# Patient Record
Sex: Female | Born: 1945 | Race: Black or African American | Hispanic: No | Smoking: Former smoker
Health system: Southern US, Community
[De-identification: ages and names within clinical notes are randomized; demographics above are authoritative.]

## PROBLEM LIST (undated history)

## (undated) DIAGNOSIS — E119 Type 2 diabetes mellitus without complications: Secondary | ICD-10-CM

## (undated) DIAGNOSIS — E785 Hyperlipidemia, unspecified: Secondary | ICD-10-CM

## (undated) DIAGNOSIS — I10 Essential (primary) hypertension: Secondary | ICD-10-CM

## (undated) DIAGNOSIS — I219 Acute myocardial infarction, unspecified: Secondary | ICD-10-CM

## (undated) HISTORY — PX: CORONARY ANGIOPLASTY WITH STENT PLACEMENT: SHX49

## (undated) HISTORY — DX: Type 2 diabetes mellitus without complications: E11.9

## (undated) HISTORY — PX: ABDOMINAL HYSTERECTOMY: SHX81

## (undated) HISTORY — DX: Essential (primary) hypertension: I10

## (undated) HISTORY — DX: Acute myocardial infarction, unspecified: I21.9

## (undated) HISTORY — DX: Hyperlipidemia, unspecified: E78.5

---

## 2005-11-13 ENCOUNTER — Emergency Department (HOSPITAL_COMMUNITY): Admission: EM | Admit: 2005-11-13 | Discharge: 2005-11-13 | Payer: Self-pay | Admitting: Emergency Medicine

## 2007-02-21 ENCOUNTER — Emergency Department (HOSPITAL_COMMUNITY): Admission: EM | Admit: 2007-02-21 | Discharge: 2007-02-22 | Payer: Self-pay | Admitting: Emergency Medicine

## 2014-09-23 ENCOUNTER — Other Ambulatory Visit (HOSPITAL_COMMUNITY): Payer: Self-pay | Admitting: Family Medicine

## 2014-09-23 DIAGNOSIS — R229 Localized swelling, mass and lump, unspecified: Principal | ICD-10-CM

## 2014-09-23 DIAGNOSIS — IMO0002 Reserved for concepts with insufficient information to code with codable children: Secondary | ICD-10-CM

## 2014-09-30 ENCOUNTER — Ambulatory Visit (HOSPITAL_COMMUNITY)
Admission: RE | Admit: 2014-09-30 | Discharge: 2014-09-30 | Disposition: A | Discharge status: Home / Self Care | Payer: PRIVATE HEALTH INSURANCE | Source: Ambulatory Visit | Attending: Family Medicine | Admitting: Family Medicine

## 2014-09-30 DIAGNOSIS — R229 Localized swelling, mass and lump, unspecified: Secondary | ICD-10-CM | POA: Insufficient documentation

## 2014-09-30 DIAGNOSIS — IMO0002 Reserved for concepts with insufficient information to code with codable children: Secondary | ICD-10-CM

## 2015-10-25 LAB — HEMOGLOBIN A1C: Hemoglobin A1C: 11.4

## 2015-10-26 ENCOUNTER — Other Ambulatory Visit (HOSPITAL_COMMUNITY): Payer: Self-pay | Admitting: Family

## 2015-10-26 DIAGNOSIS — Z09 Encounter for follow-up examination after completed treatment for conditions other than malignant neoplasm: Secondary | ICD-10-CM

## 2015-11-03 ENCOUNTER — Ambulatory Visit (HOSPITAL_COMMUNITY)
Admission: RE | Admit: 2015-11-03 | Discharge: 2015-11-03 | Disposition: A | Discharge status: Home / Self Care | Payer: Medicare Other | Source: Ambulatory Visit | Attending: Family | Admitting: Family

## 2015-11-03 DIAGNOSIS — Z09 Encounter for follow-up examination after completed treatment for conditions other than malignant neoplasm: Secondary | ICD-10-CM

## 2015-11-03 DIAGNOSIS — R921 Mammographic calcification found on diagnostic imaging of breast: Secondary | ICD-10-CM | POA: Diagnosis present

## 2015-11-19 ENCOUNTER — Encounter: Payer: Self-pay | Admitting: "Endocrinology

## 2015-11-19 ENCOUNTER — Ambulatory Visit (INDEPENDENT_AMBULATORY_CARE_PROVIDER_SITE_OTHER): Payer: Medicare Other | Admitting: "Endocrinology

## 2015-11-19 VITALS — BP 140/92 | HR 76 | Ht 65.0 in | Wt 193.0 lb

## 2015-11-19 DIAGNOSIS — E782 Mixed hyperlipidemia: Secondary | ICD-10-CM | POA: Insufficient documentation

## 2015-11-19 DIAGNOSIS — E785 Hyperlipidemia, unspecified: Secondary | ICD-10-CM | POA: Diagnosis not present

## 2015-11-19 DIAGNOSIS — N182 Chronic kidney disease, stage 2 (mild): Secondary | ICD-10-CM

## 2015-11-19 DIAGNOSIS — E1159 Type 2 diabetes mellitus with other circulatory complications: Secondary | ICD-10-CM

## 2015-11-19 DIAGNOSIS — Z794 Long term (current) use of insulin: Secondary | ICD-10-CM

## 2015-11-19 DIAGNOSIS — I10 Essential (primary) hypertension: Secondary | ICD-10-CM | POA: Diagnosis not present

## 2015-11-19 DIAGNOSIS — E1165 Type 2 diabetes mellitus with hyperglycemia: Secondary | ICD-10-CM

## 2015-11-19 DIAGNOSIS — E1122 Type 2 diabetes mellitus with diabetic chronic kidney disease: Secondary | ICD-10-CM

## 2015-11-19 DIAGNOSIS — IMO0002 Reserved for concepts with insufficient information to code with codable children: Secondary | ICD-10-CM

## 2015-11-19 MED ORDER — INSULIN GLARGINE 100 UNIT/ML SOLOSTAR PEN: 60.0000 [IU] | PEN_INJECTOR | Freq: Every day | SUBCUTANEOUS | Status: DC

## 2015-11-19 MED ORDER — INSULIN LISPRO 100 UNIT/ML (KWIKPEN): 10.0000 [IU] | PEN_INJECTOR | Freq: Three times a day (TID) | SUBCUTANEOUS | Status: DC

## 2015-11-19 NOTE — Patient Instructions (Signed)

## 2015-11-19 NOTE — Progress Notes (Signed)
Subjective:    Patient ID: Robyn Crawford, female    DOB: [birth year]. Patient is being seen in consultation for management of diabetes requested by  Altamease Oiler, FNP  Past Medical History  Diagnosis Date  . Diabetes mellitus, type II (HCC)   . Hypertension    Past Surgical History  Procedure Laterality Date  . Abdominal hysterectomy    . Coronary angioplasty with stent placement     Social History   Social History  . Marital Status: Widowed    Spouse Name: N/A  . Number of Children: N/A  . Years of Education: N/A   Social History Main Topics  . Smoking status: Former Games developer  . Smokeless tobacco: None  . Alcohol Use: No  . Drug Use: No  . Sexual Activity: Not Asked   Other Topics Concern  . None   Social History Narrative  . None   Outpatient Encounter Prescriptions as of 11/19/2015  Medication Sig  . amLODipine (NORVASC) 10 MG tablet Take 10 mg by mouth daily.  Marland Kitchen atorvastatin (LIPITOR) 20 MG tablet Take 20 mg by mouth daily.  . clopidogrel (PLAVIX) 75 MG tablet Take 75 mg by mouth daily.  . hydrochlorothiazide (HYDRODIURIL) 12.5 MG tablet Take 12.5 mg by mouth daily.  . Insulin Glargine (LANTUS SOLOSTAR) 100 UNIT/ML Solostar Pen Inject 60 Units into the skin at bedtime.  Marland Kitchen lisinopril (PRINIVIL,ZESTRIL) 40 MG tablet Take 40 mg by mouth daily.  . metFORMIN (GLUCOPHAGE) 500 MG tablet Take 500 mg by mouth 2 (two) times daily with a meal.  . [DISCONTINUED] insulin aspart (NOVOLOG FLEXPEN) 100 UNIT/ML FlexPen Inject 14 Units into the skin 3 (three) times daily with meals.  . [DISCONTINUED] Insulin Glargine (LANTUS SOLOSTAR) 100 UNIT/ML Solostar Pen Inject 60 Units into the skin at bedtime.  . insulin lispro (HUMALOG KWIKPEN) 100 UNIT/ML KiwkPen Inject 0.1-0.16 mLs (10-16 Units total) into the skin 3 (three) times daily.   No facility-administered encounter medications on file as of 11/19/2015.   ALLERGIES: Allergies  Allergen Reactions  . Codeine     VACCINATION STATUS:  There is no immunization history on file for this patient.  Diabetes She presents for her initial diabetic visit. She has type 2 diabetes mellitus. Onset time: She was diagnosed at approximate age of 70 years. Her disease course has been worsening. There are no hypoglycemic associated symptoms. Pertinent negatives for hypoglycemia include no confusion, headaches, pallor or seizures. Associated symptoms include fatigue, polydipsia and polyuria. Pertinent negatives for diabetes include no chest pain and no polyphagia. There are no hypoglycemic complications. Symptoms are worsening. Diabetic complications include heart disease. (Status post stent placement 3.) Risk factors for coronary artery disease include diabetes mellitus, dyslipidemia, hypertension, obesity, sedentary lifestyle and tobacco exposure. Current diabetic treatment includes insulin injections and oral agent (monotherapy) (She is on Lantus 60 units daily at bedtime and NovoLog 14 units 3 times a day with meals.). Her weight is increasing steadily. She is following a generally unhealthy diet. When asked about meal planning, she reported none. She has not had a previous visit with a dietitian. She never participates in exercise. Home blood sugar record trend: She did not bring any meter nor log of blood glucose to review today. She admits to monitoring only once or twice a day. An ACE inhibitor/angiotensin II receptor blocker is being taken. Eye exam is current.  Hyperlipidemia This is a chronic problem. The current episode started more than 1 year ago. Pertinent negatives include no chest pain,  myalgias or shortness of breath. Current antihyperlipidemic treatment includes statins. Risk factors for coronary artery disease include dyslipidemia, diabetes mellitus, hypertension, obesity and a sedentary lifestyle.  Hypertension This is a chronic problem. The current episode started more than 1 year ago. Pertinent negatives  include no chest pain, headaches, palpitations or shortness of breath. Past treatments include ACE inhibitors. Hypertensive end-organ damage includes CAD/MI.       Review of Systems  Constitutional: Positive for fatigue. Negative for fever, chills and unexpected weight change.  HENT: Negative for trouble swallowing and voice change.   Eyes: Negative for visual disturbance.  Respiratory: Negative for cough, shortness of breath and wheezing.   Cardiovascular: Negative for chest pain, palpitations and leg swelling.  Gastrointestinal: Negative for nausea, vomiting and diarrhea.  Endocrine: Positive for polydipsia and polyuria. Negative for cold intolerance, heat intolerance and polyphagia.  Musculoskeletal: Negative for myalgias and arthralgias.  Skin: Negative for color change, pallor, rash and wound.  Neurological: Negative for seizures and headaches.  Psychiatric/Behavioral: Negative for suicidal ideas and confusion.    Objective:    BP 140/92 mmHg  Pulse 76  Ht  (1.651 m)  Wt 193 lb (87.544 kg)  BMI 32.12 kg/m2  SpO2 95%  Wt Readings from Last 3 Encounters:  11/19/15 193 lb (87.544 kg)    Physical Exam  Constitutional: She is oriented to person, place, and time. She appears well-developed.  HENT:  Head: Normocephalic and atraumatic.  Grossly deficient dental condition. She has a dentist who is working on her teeth.   Eyes: EOM are normal.  Neck: Normal range of motion. Neck supple. No tracheal deviation present. No thyromegaly present.  Cardiovascular: Normal rate and regular rhythm.   Pulmonary/Chest: Effort normal and breath sounds normal.  Abdominal: Soft. Bowel sounds are normal. There is no tenderness. There is no guarding.  Musculoskeletal: Normal range of motion. She exhibits no edema.  Neurological: She is alert and oriented to person, place, and time. She has normal reflexes. No cranial nerve deficit. Coordination normal.  Skin: Skin is warm and dry. No rash  noted. No erythema. No pallor.  Psychiatric: She has a normal mood and affect. Judgment normal.    Assessment & Plan:   1. Uncontrolled type 2 diabetes mellitus with stage 2 chronic kidney disease,  coronary artery disease status post stent placement, with long-term current use of insulin (HCC)   - Patient has currently uncontrolled symptomatic type 2 DM since  70 years of age,  with most recent A1c of 11.4 %. Recent labs reviewed, showing stage 2 CKD.   - Her diabetes is complicated , and she  remains at a high risk for more acute and chronic complications of diabetes which include CAD, CVA, CKD, retinopathy, and neuropathy. These are all discussed in detail with the patient.  - I have counseled the patient on diet management and weight loss, by adopting a carbohydrate restricted/protein rich diet.  - Suggestion is made for patient to avoid simple carbohydrates   from their diet including Cakes , Desserts, Ice Cream,  Soda (  diet and regular) , Sweet Tea , Candies,  Chips, Cookies, Artificial Sweeteners,   and "Sugar-free" Products . This will help patient to have stable blood glucose profile and potentially avoid unintended weight gain.  - I encouraged the patient to switch to  unprocessed or minimally processed complex starch and increased protein intake (animal or plant source), fruits, and vegetables.  - Patient is advised to stick to a routine  mealtimes to eat 3 meals  a day and avoid unnecessary snacks ( to snack only to correct hypoglycemia).  - The patient will be scheduled with Norm Salt, RDN, CDE for individualized DM education.  - I have approached patient with the following individualized plan to manage diabetes and patient agrees:   - I  will proceed to readjust  basal insulin Lantus 60  units QHS, and prandial insulin Humalog 10  units TIDAC for pre-meal BG readings of 90-150mg /dl, plus patient specific correction dose for unexpected hyperglycemia above /dl,  associated with strict monitoring of glucose  AC and HS. - Patient is warned not to take insulin without proper monitoring per orders. -Adjustment parameters are given for hypo and hyperglycemia in writing. -Patient is encouraged to call clinic for blood glucose levels less than 70 or above 300 mg /dl. - I will continue metformin low-dose at 500 mg by mouth twice a day, therapeutically suitable for patient..  -Patient is not a candidate for SGLT2 inhibitors due to CKD.  - Patient will be considered for incretin therapy as appropriate next visit. - Patient specific target  A1c;  LDL, HDL, Triglycerides, and  Waist Circumference were discussed in detail.  2) BP/HTN: uncontrolled. Continue current medications including ACEI/ARB. 3) Lipids/HPL:   continue statins. 4)  Weight/Diet: CDE Consult will be initiated , exercise, and detailed carbohydrates information provided.  5) Chronic Care/Health Maintenance:  -Patient is on ACEI/ARB and Statin medications and encouraged to continue to follow up with Ophthalmology, Podiatrist at least yearly or according to recommendations, and advised to   stay away from smoking. I have recommended yearly flu vaccine and pneumonia vaccination at least every 5 years; moderate intensity exercise for up to 150 minutes weekly; and  sleep for at least 7 hours a day.  - 60 minutes of time was spent on the care of this patient , 50% of which was applied for counseling on diabetes complications and their preventions.  - Patient to bring meter and  blood glucose logs during their next visit.   - I advised patient to maintain close follow up with Altamease Oiler, FNP for primary care needs.  Follow up plan: - Return in about 1 week (around 11/26/2015) for diabetes, high blood pressure, high cholesterol, follow up with meter and logs- no labs.  Marquis Lunch, MD Phone: [phone]  Fax: [phone]   11/19/2015, 3:03 PM

## 2015-11-30 ENCOUNTER — Ambulatory Visit (INDEPENDENT_AMBULATORY_CARE_PROVIDER_SITE_OTHER): Payer: Medicare Other | Admitting: "Endocrinology

## 2015-11-30 ENCOUNTER — Encounter: Payer: Self-pay | Admitting: "Endocrinology

## 2015-11-30 VITALS — BP 148/82 | HR 83 | Ht 65.0 in | Wt 197.0 lb

## 2015-11-30 DIAGNOSIS — E1159 Type 2 diabetes mellitus with other circulatory complications: Secondary | ICD-10-CM | POA: Diagnosis not present

## 2015-11-30 DIAGNOSIS — I10 Essential (primary) hypertension: Secondary | ICD-10-CM | POA: Diagnosis not present

## 2015-11-30 DIAGNOSIS — E785 Hyperlipidemia, unspecified: Secondary | ICD-10-CM

## 2015-11-30 MED ORDER — INSULIN GLARGINE 100 UNIT/ML SOLOSTAR PEN: 50.0000 [IU] | PEN_INJECTOR | Freq: Every day | SUBCUTANEOUS | Status: DC

## 2015-11-30 MED ORDER — INSULIN LISPRO 100 UNIT/ML (KWIKPEN): 5.0000 [IU] | PEN_INJECTOR | Freq: Three times a day (TID) | SUBCUTANEOUS | Status: DC

## 2015-11-30 NOTE — Patient Instructions (Signed)

## 2015-11-30 NOTE — Progress Notes (Signed)
Subjective:    Patient ID: Robyn Crawford, female    DOB: 11-16-1945. Patient is Here to follow-up for management of diabetes requested by   Altamease OilerHARRIS,MEREDITH L, FNP  Past Medical History  Diagnosis Date  . Diabetes mellitus, type II (HCC)   . Hypertension    Past Surgical History  Procedure Laterality Date  . Abdominal hysterectomy    . Coronary angioplasty with stent placement     Social History   Social History  . Marital Status: Widowed    Spouse Name: N/A  . Number of Children: N/A  . Years of Education: N/A   Social History Main Topics  . Smoking status: Former Games developermoker  . Smokeless tobacco: None  . Alcohol Use: No  . Drug Use: No  . Sexual Activity: Not Asked   Other Topics Concern  . None   Social History Narrative   Outpatient Encounter Prescriptions as of 11/30/2015  Medication Sig  . amLODipine (NORVASC) 10 MG tablet Take 10 mg by mouth daily.  Marland Kitchen. atorvastatin (LIPITOR) 20 MG tablet Take 20 mg by mouth daily.  . clopidogrel (PLAVIX) 75 MG tablet Take 75 mg by mouth daily.  . hydrochlorothiazide (HYDRODIURIL) 12.5 MG tablet Take 12.5 mg by mouth daily.  . Insulin Glargine (LANTUS SOLOSTAR) 100 UNIT/ML Solostar Pen Inject 50 Units into the skin at bedtime.  . insulin lispro (HUMALOG KWIKPEN) 100 UNIT/ML KiwkPen Inject 0.05-0.11 mLs (5-11 Units total) into the skin 3 (three) times daily.  Marland Kitchen. lisinopril (PRINIVIL,ZESTRIL) 40 MG tablet Take 40 mg by mouth daily.  . metFORMIN (GLUCOPHAGE) 500 MG tablet Take 500 mg by mouth 2 (two) times daily with a meal.  . [DISCONTINUED] Insulin Glargine (LANTUS SOLOSTAR) 100 UNIT/ML Solostar Pen Inject 60 Units into the skin at bedtime.  . [DISCONTINUED] insulin lispro (HUMALOG KWIKPEN) 100 UNIT/ML KiwkPen Inject 0.1-0.16 mLs (10-16 Units total) into the skin 3 (three) times daily.   No facility-administered encounter medications on file as of 11/30/2015.   ALLERGIES: Allergies  Allergen Reactions  . Codeine    VACCINATION  STATUS:  There is no immunization history on file for this patient.  Diabetes She presents for her follow-up diabetic visit. She has type 2 diabetes mellitus. Onset time: She was diagnosed at approximate age of 51 years. Her disease course has been improving. There are no hypoglycemic associated symptoms. Pertinent negatives for hypoglycemia include no confusion, headaches, pallor or seizures. Associated symptoms include fatigue. Pertinent negatives for diabetes include no chest pain, no polydipsia, no polyphagia and no polyuria. There are no hypoglycemic complications. Symptoms are improving. Diabetic complications include heart disease. (Status post stent placement 3.) Risk factors for coronary artery disease include diabetes mellitus, dyslipidemia, hypertension, obesity, sedentary lifestyle and tobacco exposure. Current diabetic treatment includes insulin injections and oral agent (monotherapy) (She is on Lantus 60 units daily at bedtime and NovoLog 14 units 3 times a day with meals.). Her weight is increasing steadily. She is following a generally unhealthy diet. When asked about meal planning, she reported none. She has not had a previous visit with a dietitian. She never participates in exercise. Home blood sugar record trend: She came with dramatic improvement in her glucose profile averaging 128 last 7 days. Her breakfast blood glucose range is generally 110-130 mg/dl. Her lunch blood glucose range is generally 110-130 mg/dl. Her dinner blood glucose range is generally 110-130 mg/dl. Her overall blood glucose range is 110-130 mg/dl. An ACE inhibitor/angiotensin II receptor blocker is being taken. Eye exam  is current.  Hyperlipidemia This is a chronic problem. The current episode started more than 1 year ago. Pertinent negatives include no chest pain, myalgias or shortness of breath. Current antihyperlipidemic treatment includes statins. Risk factors for coronary artery disease include dyslipidemia,  diabetes mellitus, hypertension, obesity and a sedentary lifestyle.  Hypertension This is a chronic problem. The current episode started more than 1 year ago. Pertinent negatives include no chest pain, headaches, palpitations or shortness of breath. Past treatments include ACE inhibitors. Hypertensive end-organ damage includes CAD/MI.       Review of Systems  Constitutional: Positive for fatigue. Negative for fever, chills and unexpected weight change.  HENT: Negative for trouble swallowing and voice change.   Eyes: Negative for visual disturbance.  Respiratory: Negative for cough, shortness of breath and wheezing.   Cardiovascular: Negative for chest pain, palpitations and leg swelling.  Gastrointestinal: Negative for nausea, vomiting and diarrhea.  Endocrine: Negative for cold intolerance, heat intolerance, polydipsia, polyphagia and polyuria.  Musculoskeletal: Negative for myalgias and arthralgias.  Skin: Negative for color change, pallor, rash and wound.  Neurological: Negative for seizures and headaches.  Psychiatric/Behavioral: Negative for suicidal ideas and confusion.    Objective:    BP 148/82 mmHg  Pulse 83  Ht  (1.651 m)  Wt 197 lb (89.359 kg)  BMI 32.78 kg/m2  SpO2 96%  Wt Readings from Last 3 Encounters:  11/30/15 197 lb (89.359 kg)  11/19/15 193 lb (87.544 kg)    Physical Exam  Constitutional: She is oriented to person, place, and time. She appears well-developed.  HENT:  Head: Normocephalic and atraumatic.  Grossly deficient dental condition. She has a dentist who is working on her teeth.   Eyes: EOM are normal.  Neck: Normal range of motion. Neck supple. No tracheal deviation present. No thyromegaly present.  Cardiovascular: Normal rate and regular rhythm.   Pulmonary/Chest: Effort normal and breath sounds normal.  Abdominal: Soft. Bowel sounds are normal. There is no tenderness. There is no guarding.  Musculoskeletal: Normal range of motion. She  exhibits no edema.  Neurological: She is alert and oriented to person, place, and time. She has normal reflexes. No cranial nerve deficit. Coordination normal.  Skin: Skin is warm and dry. No rash noted. No erythema. No pallor.  Psychiatric: She has a normal mood and affect. Judgment normal.    Assessment & Plan:   1. Uncontrolled type 2 diabetes mellitus with stage 2 chronic kidney disease,  coronary artery disease status post stent placement, with long-term current use of insulin (HCC)   - Patient has currently uncontrolled symptomatic type 2 DM since  70 years of age,  with most recent A1c of 11.4 %. Recent labs reviewed, showing stage 2 CKD.   - Her diabetes is complicated , and she  remains at a high risk for more acute and chronic complications of diabetes which include CAD, CVA, CKD, retinopathy, and neuropathy. These are all discussed in detail with the patient.  - I have counseled the patient on diet management and weight loss, by adopting a carbohydrate restricted/protein rich diet.  - Suggestion is made for patient to avoid simple carbohydrates   from their diet including Cakes , Desserts, Ice Cream,  Soda (  diet and regular) , Sweet Tea , Candies,  Chips, Cookies, Artificial Sweeteners,   and "Sugar-free" Products . This will help patient to have stable blood glucose profile and potentially avoid unintended weight gain.  - I encouraged the patient to switch to  unprocessed or minimally processed complex starch and increased protein intake (animal or plant source), fruits, and vegetables.  - Patient is advised to stick to a routine mealtimes to eat 3 meals  a day and avoid unnecessary snacks ( to snack only to correct hypoglycemia).  - The patient will be scheduled with Norm Salt, RDN, CDE for individualized DM education.  - I have approached patient with the following individualized plan to manage diabetes and patient agrees:   - I  will proceed to readjust  basal insulin  Lantus 50  units QHS, and prandial insulin Humalog 5  units TIDAC for pre-meal BG readings of 90-150mg /dl, plus patient specific correction dose for unexpected hyperglycemia above /dl, associated with strict monitoring of glucose  AC and HS. - Patient is warned not to take insulin without proper monitoring per orders. -Adjustment parameters are given for hypo and hyperglycemia in writing. -Patient is encouraged to call clinic for blood glucose levels less than 70 or above 300 mg /dl. - I will continue metformin low-dose at 500 mg by mouth twice a day, therapeutically suitable for patient..  -Patient is not a candidate for SGLT2 inhibitors due to CKD.  - Patient will be considered for incretin therapy as appropriate next visit. - Patient specific target  A1c;  LDL, HDL, Triglycerides, and  Waist Circumference were discussed in detail.  2) BP/HTN: uncontrolled. Continue current medications including ACEI/ARB. 3) Lipids/HPL:   continue statins. 4)  Weight/Diet: CDE Consult will be initiated , exercise, and detailed carbohydrates information provided.  5) Chronic Care/Health Maintenance:  -Patient is on ACEI/ARB and Statin medications and encouraged to continue to follow up with Ophthalmology, Podiatrist at least yearly or according to recommendations, and advised to   stay away from smoking. I have recommended yearly flu vaccine and pneumonia vaccination at least every 5 years; moderate intensity exercise for up to 150 minutes weekly; and  sleep for at least 7 hours a day.  - 25 minutes of time was spent on the care of this patient , 50% of which was applied for counseling on diabetes complications and their preventions.  - Patient to bring meter and  blood glucose logs during their next visit.   - I advised patient to maintain close follow up with Altamease Oiler, FNP for primary care needs.  Follow up plan: - Return in about 9 weeks (around 02/01/2016) for diabetes, high blood  pressure, high cholesterol, follow up with pre-visit labs, meter, and logs.  Marquis Lunch, MD Phone: [phone]  Fax: [phone]   11/30/2015, 3:00 PM

## 2016-01-04 ENCOUNTER — Encounter: Payer: Medicare Other | Attending: "Endocrinology | Admitting: Nutrition

## 2016-01-04 ENCOUNTER — Encounter: Payer: Self-pay | Admitting: Nutrition

## 2016-01-04 VITALS — Ht 64.0 in | Wt 194.0 lb

## 2016-01-04 DIAGNOSIS — E1122 Type 2 diabetes mellitus with diabetic chronic kidney disease: Secondary | ICD-10-CM | POA: Diagnosis not present

## 2016-01-04 DIAGNOSIS — N182 Chronic kidney disease, stage 2 (mild): Secondary | ICD-10-CM | POA: Insufficient documentation

## 2016-01-04 DIAGNOSIS — E1165 Type 2 diabetes mellitus with hyperglycemia: Secondary | ICD-10-CM | POA: Insufficient documentation

## 2016-01-04 DIAGNOSIS — Z794 Long term (current) use of insulin: Secondary | ICD-10-CM | POA: Diagnosis not present

## 2016-01-04 DIAGNOSIS — E669 Obesity, unspecified: Secondary | ICD-10-CM

## 2016-01-04 DIAGNOSIS — E118 Type 2 diabetes mellitus with unspecified complications: Secondary | ICD-10-CM

## 2016-01-04 NOTE — Patient Instructions (Addendum)
Goals 1. Follow My Plate Method 2. Eat 2-3 carb choices per meal. Increase low carb veggies. 3. Eat meals on time. 4. Portion foods out. 5. Cut out snacks between meals. 6. Drink only water 7. Take meds as prescribed. 8. Cut out ice cream and diet sodas. 9. Get A1C down to 7% in three months. 10. Exercise 30 minutes daily.

## 2016-01-04 NOTE — Progress Notes (Signed)
  Medical Nutrition Therapy:  Appt start time: 1100 end time:  1200.  Assessment:  Primary concerns today: Diabetes Type 2. Sees Dr. Fransico HimNida. Her son lives with her. Most Recent A1C 11.4%. Currently on 50 units Lantus and 5 units Humalog with meals and slidign scale. 500 mg of Metformin BID  7 day  avg 130 mg/dl, 14 day 086137 mg/dl and 30 days 578138 mg/dl. [facility] but now doing a lot of yard work. BS have dramatically improved since coming to see Dr. Fransico HimNida and working hard on eating right. She has been cutting out snacks and eating healthier foods. She use to crave sweets but now her BS are better, those cravings have gone away. She wants to lose weight. Her BS will drop a little when she goes too long between meals.  Diet is improving. Still needs consistent carbs per meal and avoid snacking after supper.   Lab Results  Component Value Date   HGBA1C 11.4 10/25/2015    Preferred Learning Style:  No preference indicated   Learning Readiness:  Not ready  Contemplating  Ready  Change in progress   MEDICATIONS: See list   DIETARY INTAKE:  24-hr recall:  B ( AM): Oatmeal and Egg and 1 slice bacon, coffee Snk ( AM):   L ( PM): BBG ribs, butter beans and corn, baked tomatoes and 1 roll, Diet Pepsi Snk ( PM): Glucerna  D ( PM):  Ice cream sandwich , water Snk ( PM):  Beverages:water  Usual physical activity: [facility]  Estimated energy needs: 1500 calories 170 g carbohydrates 112 g protein 42 g fat  Progress Towards Goal(s):  In progress.   Nutritional Diagnosis:  NB-1.1 Food and nutrition-related knowledge deficit As related to Diabetes.  As evidenced by A1C 11,4%.    Intervention:  Nutrition and Diabetes education provided on My Plate, CHO counting, meal planning, portion sizes, timing of meals, avoiding snacks between meals unless having a low blood sugar, target ranges for A1C and blood sugars, signs/symptoms and treatment of hyper/hypoglycemia, monitoring blood  sugars, taking medications as prescribed, benefits of exercising 30 minutes per day and prevention of complications of DM.  Goals 1. Follow My Plate Method 2. Eat 2-3 carb choices per meal. 3. Eat meals on time. 4. Portion foods out. 5. Cut out snacks between meals. 6. Drink only water 7. Take meds as prescribed. 8. Cut out ice cream and diet sodas. 9. Get A1C down to 7% in three months. 10. Exercise 30 minutes daily.  Teaching Method Utilized:  Visual Auditory Hands on  Handouts given during visit include:  The Plate Method   Meal Plan Card  Diabetes Instructions.   Barriers to learning/adherence to lifestyle change:  None  Demonstrated degree of understanding via:  Teach Back   Monitoring/Evaluation:  Dietary intake, exercise, meal planning, SBG, and body weight in 1 month(s).

## 2016-01-26 ENCOUNTER — Other Ambulatory Visit: Payer: Self-pay | Admitting: "Endocrinology

## 2016-01-26 LAB — BASIC METABOLIC PANEL
BUN: 16 mg/dL (ref 7–25)
CALCIUM: 10.1 mg/dL (ref 8.6–10.4)
CHLORIDE: 102 mmol/L (ref 98–110)
CO2: 28 mmol/L (ref 20–31)
CREATININE: 1 mg/dL — AB (ref 0.50–0.99)
Glucose, Bld: 82 mg/dL (ref 65–99)
Potassium: 4.3 mmol/L (ref 3.5–5.3)
Sodium: 142 mmol/L (ref 135–146)

## 2016-01-26 LAB — LIPID PANEL
CHOL/HDL RATIO: 3.4 ratio (ref <=5.0)
Cholesterol: 156 mg/dL (ref 125–200)
HDL: 46 mg/dL (ref >=46)
LDL Cholesterol: 95 mg/dL (ref <130)
TRIGLYCERIDES: 73 mg/dL (ref <150)
VLDL: 15 mg/dL (ref <30)

## 2016-01-26 LAB — HEMOGLOBIN A1C
Hgb A1c MFr Bld: 8.1 % — ABNORMAL HIGH (ref <5.7)
MEAN PLASMA GLUCOSE: 186 mg/dL

## 2016-01-26 LAB — TSH: TSH: 0.71 mIU/L

## 2016-01-26 LAB — T4, FREE: Free T4: 1.2 ng/dL (ref 0.8–1.8)

## 2016-02-01 ENCOUNTER — Ambulatory Visit: Payer: Medicare Other | Admitting: "Endocrinology

## 2016-02-03 ENCOUNTER — Encounter: Payer: Medicare Other | Attending: "Endocrinology | Admitting: Nutrition

## 2016-02-03 ENCOUNTER — Encounter: Payer: Self-pay | Admitting: Nutrition

## 2016-02-03 ENCOUNTER — Other Ambulatory Visit: Payer: Self-pay | Admitting: "Endocrinology

## 2016-02-03 ENCOUNTER — Encounter: Payer: Self-pay | Admitting: "Endocrinology

## 2016-02-03 ENCOUNTER — Ambulatory Visit (INDEPENDENT_AMBULATORY_CARE_PROVIDER_SITE_OTHER): Payer: Medicare Other | Admitting: "Endocrinology

## 2016-02-03 VITALS — Ht 65.0 in | Wt 197.0 lb

## 2016-02-03 VITALS — BP 136/82 | HR 100 | Ht 64.0 in | Wt 197.0 lb

## 2016-02-03 DIAGNOSIS — N182 Chronic kidney disease, stage 2 (mild): Secondary | ICD-10-CM

## 2016-02-03 DIAGNOSIS — E785 Hyperlipidemia, unspecified: Secondary | ICD-10-CM | POA: Diagnosis not present

## 2016-02-03 DIAGNOSIS — E669 Obesity, unspecified: Secondary | ICD-10-CM

## 2016-02-03 DIAGNOSIS — E1169 Type 2 diabetes mellitus with other specified complication: Principal | ICD-10-CM

## 2016-02-03 DIAGNOSIS — E1122 Type 2 diabetes mellitus with diabetic chronic kidney disease: Secondary | ICD-10-CM | POA: Insufficient documentation

## 2016-02-03 DIAGNOSIS — I10 Essential (primary) hypertension: Secondary | ICD-10-CM

## 2016-02-03 DIAGNOSIS — E1165 Type 2 diabetes mellitus with hyperglycemia: Secondary | ICD-10-CM | POA: Diagnosis present

## 2016-02-03 DIAGNOSIS — E1159 Type 2 diabetes mellitus with other circulatory complications: Secondary | ICD-10-CM | POA: Diagnosis not present

## 2016-02-03 DIAGNOSIS — E118 Type 2 diabetes mellitus with unspecified complications: Secondary | ICD-10-CM

## 2016-02-03 DIAGNOSIS — Z794 Long term (current) use of insulin: Secondary | ICD-10-CM

## 2016-02-03 DIAGNOSIS — IMO0002 Reserved for concepts with insufficient information to code with codable children: Secondary | ICD-10-CM

## 2016-02-03 MED ORDER — CANAGLIFLOZIN 100 MG PO TABS: 100.0000 mg | ORAL_TABLET | Freq: Every day | ORAL | Status: DC

## 2016-02-03 MED ORDER — INSULIN GLARGINE 100 UNIT/ML SOLOSTAR PEN: 40.0000 [IU] | PEN_INJECTOR | Freq: Every day | SUBCUTANEOUS | Status: DC

## 2016-02-03 NOTE — Progress Notes (Signed)
Subjective:    Patient ID: Robyn Crawford, female    DOB: 04-19-1946. Patient is Here to follow-up for management of diabetes requested by   Altamease OilerHARRIS,MEREDITH L, FNP  Past Medical History  Diagnosis Date  . Diabetes mellitus, type II (HCC)   . Hypertension    Past Surgical History  Procedure Laterality Date  . Abdominal hysterectomy    . Coronary angioplasty with stent placement     Social History   Social History  . Marital Status: Widowed    Spouse Name: N/A  . Number of Children: N/A  . Years of Education: N/A   Social History Main Topics  . Smoking status: Former Games developermoker  . Smokeless tobacco: None  . Alcohol Use: No  . Drug Use: No  . Sexual Activity: Not Asked   Other Topics Concern  . None   Social History Narrative   Outpatient Encounter Prescriptions as of 02/03/2016  Medication Sig  . amLODipine (NORVASC) 10 MG tablet Take 10 mg by mouth daily.  Marland Kitchen. atorvastatin (LIPITOR) 20 MG tablet Take 20 mg by mouth daily.  . canagliflozin (INVOKANA) 100 MG TABS tablet Take 1 tablet (100 mg total) by mouth daily before breakfast.  . clopidogrel (PLAVIX) 75 MG tablet Take 75 mg by mouth daily.  . hydrochlorothiazide (HYDRODIURIL) 12.5 MG tablet Take 12.5 mg by mouth daily.  . Insulin Glargine (LANTUS SOLOSTAR) 100 UNIT/ML Solostar Pen Inject 40 Units into the skin at bedtime.  Marland Kitchen. lisinopril (PRINIVIL,ZESTRIL) 40 MG tablet Take 40 mg by mouth daily.  . metFORMIN (GLUCOPHAGE) 500 MG tablet Take 500 mg by mouth 2 (two) times daily with a meal.  . [DISCONTINUED] Insulin Glargine (LANTUS SOLOSTAR) 100 UNIT/ML Solostar Pen Inject 50 Units into the skin at bedtime.  . [DISCONTINUED] insulin lispro (HUMALOG KWIKPEN) 100 UNIT/ML KiwkPen Inject 0.05-0.11 mLs (5-11 Units total) into the skin 3 (three) times daily.   No facility-administered encounter medications on file as of 02/03/2016.   ALLERGIES: Allergies  Allergen Reactions  . Codeine    VACCINATION STATUS:  There is no  immunization history on file for this patient.  Diabetes She presents for her follow-up diabetic visit. She has type 2 diabetes mellitus. Onset time: She was diagnosed at approximate age of 51 years. Her disease course has been improving. There are no hypoglycemic associated symptoms. Pertinent negatives for hypoglycemia include no confusion, headaches, pallor or seizures. Associated symptoms include fatigue. Pertinent negatives for diabetes include no chest pain, no polydipsia, no polyphagia and no polyuria. There are no hypoglycemic complications. Symptoms are improving. Diabetic complications include heart disease. (Status post stent placement 3.) Risk factors for coronary artery disease include diabetes mellitus, dyslipidemia, hypertension, obesity, sedentary lifestyle and tobacco exposure. Current diabetic treatment includes insulin injections and oral agent (monotherapy) (She is on Lantus 60 units daily at bedtime and NovoLog 14 units 3 times a day with meals.). Her weight is decreasing steadily. She is following a generally unhealthy diet. When asked about meal planning, she reported none. She has had a previous visit with a dietitian. She never participates in exercise. Home blood sugar record trend: She came with dramatic improvement in her glucose profile averaging 128 last 7 days. Her breakfast blood glucose range is generally 130-140 mg/dl. Her lunch blood glucose range is generally 130-140 mg/dl. Her dinner blood glucose range is generally 130-140 mg/dl. Her overall blood glucose range is 130-140 mg/dl. An ACE inhibitor/angiotensin II receptor blocker is being taken. Eye exam is current.  Hyperlipidemia  This is a chronic problem. The current episode started more than 1 year ago. Pertinent negatives include no chest pain, myalgias or shortness of breath. Current antihyperlipidemic treatment includes statins. Risk factors for coronary artery disease include dyslipidemia, diabetes mellitus,  hypertension, obesity and a sedentary lifestyle.  Hypertension This is a chronic problem. The current episode started more than 1 year ago. Pertinent negatives include no chest pain, headaches, palpitations or shortness of breath. Past treatments include ACE inhibitors. Hypertensive end-organ damage includes CAD/MI.       Review of Systems  Constitutional: Positive for fatigue. Negative for fever, chills and unexpected weight change.  HENT: Negative for trouble swallowing and voice change.   Eyes: Negative for visual disturbance.  Respiratory: Negative for cough, shortness of breath and wheezing.   Cardiovascular: Negative for chest pain, palpitations and leg swelling.  Gastrointestinal: Negative for nausea, vomiting and diarrhea.  Endocrine: Negative for cold intolerance, heat intolerance, polydipsia, polyphagia and polyuria.  Musculoskeletal: Negative for myalgias and arthralgias.  Skin: Negative for color change, pallor, rash and wound.  Neurological: Negative for seizures and headaches.  Psychiatric/Behavioral: Negative for suicidal ideas and confusion.    Objective:    BP 136/82 mmHg  Pulse 100  Ht  (1.626 m)  Wt 197 lb (89.359 kg)  BMI 33.80 kg/m2  SpO2 98%  Wt Readings from Last 3 Encounters:  02/03/16 197 lb (89.359 kg)  01/04/16 194 lb (87.998 kg)  11/30/15 197 lb (89.359 kg)    Physical Exam  Constitutional: She is oriented to person, place, and time. She appears well-developed.  HENT:  Head: Normocephalic and atraumatic.  Grossly deficient dental condition. She has a dentist who is working on her teeth.   Eyes: EOM are normal.  Neck: Normal range of motion. Neck supple. No tracheal deviation present. No thyromegaly present.  Cardiovascular: Normal rate and regular rhythm.   Pulmonary/Chest: Effort normal and breath sounds normal.  Abdominal: Soft. Bowel sounds are normal. There is no tenderness. There is no guarding.  Musculoskeletal: Normal range of  motion. She exhibits no edema.  Neurological: She is alert and oriented to person, place, and time. She has normal reflexes. No cranial nerve deficit. Coordination normal.  Skin: Skin is warm and dry. No rash noted. No erythema. No pallor.  Psychiatric: She has a normal mood and affect. Judgment normal.    Assessment & Plan:   1. Uncontrolled type 2 diabetes mellitus with stage 2 chronic kidney disease,  coronary artery disease status post stent placement, with long-term current use of insulin (HCC)   - Patient has currently uncontrolled symptomatic type 2 DM since  70 years of age,  with most recent A1c of 8.1% improving from 11.4 %. Recent labs reviewed, showing  improving renal function.    - Her diabetes is complicated , and she  remains at a high risk for more acute and chronic complications of diabetes which include CAD, CVA, CKD, retinopathy, and neuropathy. These are all discussed in detail with the patient.  - I have counseled the patient on diet management and weight loss, by adopting a carbohydrate restricted/protein rich diet.  - Suggestion is made for patient to avoid simple carbohydrates   from their diet including Cakes , Desserts, Ice Cream,  Soda (  diet and regular) , Sweet Tea , Candies,  Chips, Cookies, Artificial Sweeteners,   and "Sugar-free" Products . This will help patient to have stable blood glucose profile and potentially avoid unintended weight gain.  - I  encouraged the patient to switch to  unprocessed or minimally processed complex starch and increased protein intake (animal or plant source), fruits, and vegetables.  - Patient is advised to stick to a routine mealtimes to eat 3 meals  a day and avoid unnecessary snacks ( to snack only to correct hypoglycemia).  - The patient will be scheduled with Norm Salt, RDN, CDE for individualized DM education.  - I have approached patient with the following individualized plan to manage diabetes and patient agrees:    - She came with significantly improved glycemic profile. I  will proceed to readjust  basal insulin Lantus 40  units QHS, and  hold prandial insulin for now. -Continue  monitoring of glucose  AC  breakfast and HS. - Patient is warned not to take insulin without proper monitoring per orders.  -Patient is encouraged to call clinic for blood glucose levels less than 70 or above 300 mg /dl. - I will continue metformin low-dose at 500 mg by mouth twice a day, therapeutically suitable for patient. -I will add Invokana 100 mg by mouth every morning. Side effects and precautions discussed with her.  - Patient will be considered for incretin therapy as appropriate next visit. - Patient specific target  A1c;  LDL, HDL, Triglycerides, and  Waist Circumference were discussed in detail.  2) BP/HTN: uncontrolled. Continue current medications including ACEI/ARB. 3) Lipids/HPL:   continue statins. 4)  Weight/Diet: CDE Consult will be initiated , exercise, and detailed carbohydrates information provided.  5) Chronic Care/Health Maintenance:  -Patient is on ACEI/ARB and Statin medications and encouraged to continue to follow up with Ophthalmology, Podiatrist at least yearly or according to recommendations, and advised to   stay away from smoking. I have recommended yearly flu vaccine and pneumonia vaccination at least every 5 years; moderate intensity exercise for up to 150 minutes weekly; and  sleep for at least 7 hours a day.  - 25 minutes of time was spent on the care of this patient , 50% of which was applied for counseling on diabetes complications and their preventions.  - Patient to bring meter and  blood glucose logs during their next visit.   - I advised patient to maintain close follow up with Altamease Oiler, FNP for primary care needs.  Follow up plan: - Return in about 3 months (around 05/05/2016) for diabetes, high blood pressure, high cholesterol, follow up with pre-visit labs, meter,  and logs.  Marquis Lunch, MD Phone: [phone]  Fax: [phone]   02/03/2016, 9:55 AM

## 2016-02-03 NOTE — Patient Instructions (Signed)
Goals 1. Follow My Plate Method 2. Eat 2-3 carb choices per meal. 3. Eat meals on time. 4. Portion foods out.. 5. Drink only water 6.. Get A1C down to 7% in three months. 7.  Exercise 30 minutes daily. 8.   Lose 1-2  lbs per month

## 2016-02-03 NOTE — Progress Notes (Signed)
  Medical Nutrition Therapy:  Appt start time: 1000 end time:  1030.  Assessment:  Primary concerns today: Diabetes Type 2. Sees Dr. Fransico HimNida. Follow up. A1C down from 11% down to 8.1%.  BS are much better.   Her son lives with her.  Currently on 50 units Lantus and 5 units Humalog with meals and sliding scale. But now changing it to 40 units of  Latnus and stopping Novolog with meals since blood sugars are much better.. 500 mg of Metformin BID  And adding Invokana daily. 100 mg. Feels better. Has more energy. [facility]. Has cut out sods and ice cream.  Meals are better balanced. Needs more lower carb vegetables.   Lab Results  Component Value Date   HGBA1C 8.1* 01/26/2016    Preferred Learning Style:  No preference indicated   Learning Readiness:  Not ready  Contemplating  Ready  Change in progress   MEDICATIONS: See list   DIETARY INTAKE:  24-hr recall:  B ( AM): Strawberries and grapes -4 and greek yogurt Snk ( AM):   L ( PM): [place] sandwich coffee, water Snk ( PM): D ( PM):  Pinto beans, 1/2 c green beans, and baked chicken and unsweet tea Snk ( PM):  Beverages:water  Usual physical activity: [facility]  Estimated energy needs: 1500 calories 170 g carbohydrates 112 g protein 42 g fat  Progress Towards Goal(s):  In progress.   Nutritional Diagnosis:  NB-1.1 Food and nutrition-related knowledge deficit As related to Diabetes.  As evidenced by A1C 11,4%.    Intervention:  Nutrition and Diabetes education provided on My Plate, CHO counting, meal planning, portion sizes, timing of meals, avoiding snacks between meals unless having a low blood sugar, target ranges for A1C and blood sugars, signs/symptoms and treatment of hyper/hypoglycemia, monitoring blood sugars, taking medications as prescribed, benefits of exercising 30 minutes per day and prevention of complications of DM.  Goals 1. Follow My Plate Method 2. Eat 2-3 carb choices per meal. 3. Eat meals  on time. 4. Portion foods out.. 5. Drink only water 6.. Get A1C down to 7% in three months. 7.  Exercise 30 minutes daily. 8.   Lose 1-2  lbs per month  Teaching Method Utilized:  Visual Auditory Hands on  Handouts given during visit include:  The Plate Method   Meal Plan Card  Diabetes Instructions.   Barriers to learning/adherence to lifestyle change:  None  Demonstrated degree of understanding via:  Teach Back   Monitoring/Evaluation:  Dietary intake, exercise, meal planning, SBG, and body weight in 3 month(s).

## 2016-02-03 NOTE — Patient Instructions (Signed)

## 2016-02-15 ENCOUNTER — Other Ambulatory Visit: Payer: Self-pay

## 2016-02-15 MED ORDER — ATORVASTATIN CALCIUM 20 MG PO TABS: 20.0000 mg | ORAL_TABLET | Freq: Every day | ORAL | Status: DC

## 2016-02-15 MED ORDER — METFORMIN HCL 500 MG PO TABS: 500.0000 mg | ORAL_TABLET | Freq: Two times a day (BID) | ORAL | Status: DC

## 2016-02-15 MED ORDER — LISINOPRIL 40 MG PO TABS: 40.0000 mg | ORAL_TABLET | Freq: Every day | ORAL | Status: DC

## 2016-02-15 MED ORDER — AMLODIPINE BESYLATE 10 MG PO TABS: 10.0000 mg | ORAL_TABLET | Freq: Every day | ORAL | Status: DC

## 2016-02-15 MED ORDER — HYDROCHLOROTHIAZIDE 12.5 MG PO TABS: 12.5000 mg | ORAL_TABLET | Freq: Every day | ORAL | Status: DC

## 2016-04-28 ENCOUNTER — Other Ambulatory Visit: Payer: Self-pay | Admitting: "Endocrinology

## 2016-04-28 LAB — BASIC METABOLIC PANEL
BUN: 20 mg/dL (ref 7–25)
CHLORIDE: 104 mmol/L (ref 98–110)
CO2: 29 mmol/L (ref 20–31)
CREATININE: 1.15 mg/dL — AB (ref 0.50–0.99)
Calcium: 10.3 mg/dL (ref 8.6–10.4)
Glucose, Bld: 97 mg/dL (ref 65–99)
POTASSIUM: 4.3 mmol/L (ref 3.5–5.3)
SODIUM: 142 mmol/L (ref 135–146)

## 2016-04-29 LAB — HEMOGLOBIN A1C
HEMOGLOBIN A1C: 8 % — AB (ref <5.7)
MEAN PLASMA GLUCOSE: 183 mg/dL

## 2016-05-05 ENCOUNTER — Ambulatory Visit (INDEPENDENT_AMBULATORY_CARE_PROVIDER_SITE_OTHER): Payer: Medicare Other | Admitting: "Endocrinology

## 2016-05-05 ENCOUNTER — Encounter: Payer: Self-pay | Admitting: "Endocrinology

## 2016-05-05 ENCOUNTER — Encounter: Payer: Medicare Other | Attending: "Endocrinology | Admitting: Nutrition

## 2016-05-05 VITALS — BP 136/82 | HR 90 | Resp 18 | Ht 65.0 in | Wt 186.0 lb

## 2016-05-05 VITALS — Ht 65.0 in | Wt 185.6 lb

## 2016-05-05 DIAGNOSIS — IMO0002 Reserved for concepts with insufficient information to code with codable children: Secondary | ICD-10-CM

## 2016-05-05 DIAGNOSIS — E1159 Type 2 diabetes mellitus with other circulatory complications: Secondary | ICD-10-CM | POA: Diagnosis not present

## 2016-05-05 DIAGNOSIS — E1122 Type 2 diabetes mellitus with diabetic chronic kidney disease: Secondary | ICD-10-CM | POA: Insufficient documentation

## 2016-05-05 DIAGNOSIS — I10 Essential (primary) hypertension: Secondary | ICD-10-CM

## 2016-05-05 DIAGNOSIS — Z794 Long term (current) use of insulin: Secondary | ICD-10-CM | POA: Diagnosis not present

## 2016-05-05 DIAGNOSIS — E1165 Type 2 diabetes mellitus with hyperglycemia: Secondary | ICD-10-CM | POA: Diagnosis present

## 2016-05-05 DIAGNOSIS — E785 Hyperlipidemia, unspecified: Secondary | ICD-10-CM | POA: Diagnosis not present

## 2016-05-05 DIAGNOSIS — E669 Obesity, unspecified: Secondary | ICD-10-CM

## 2016-05-05 DIAGNOSIS — N182 Chronic kidney disease, stage 2 (mild): Secondary | ICD-10-CM | POA: Insufficient documentation

## 2016-05-05 DIAGNOSIS — E118 Type 2 diabetes mellitus with unspecified complications: Secondary | ICD-10-CM

## 2016-05-05 NOTE — Patient Instructions (Signed)

## 2016-05-05 NOTE — Progress Notes (Signed)
  Medical Nutrition Therapy:  Appt start time: 0800 end time:  0830.  Assessment:  Primary concerns today: Diabetes Type 2. Sees Dr. Fransico HimNida today.. Follow up. A1C down from 11% down to 8.0%. Lost 11 lbs.  BS are doing better. She is drinking a lot of water and [facility]  [facility].  Still taking Invokana daily and Metformin. Still working on increasing low carb vegetables and eating three meals per day.   Lab Results  Component Value Date   HGBA1C 8.0 (H) 04/28/2016    Preferred Learning Style:  No preference indicated   Learning Readiness:   Change in progress   MEDICATIONS: See list   DIETARY INTAKE:  24-hr recall:  B ( AM): Strawberries and grapes -4 and greek yogurt Snk ( AM):   L ( PM): [place] sandwich coffee, water Snk ( PM): D ( PM):  Pinto beans, 1/2 c green beans, and baked chicken and unsweet tea Snk ( PM):  Beverages:water  Usual physical activity: [facility]  Estimated energy needs: 1500 calories 170 g carbohydrates 112 g protein 42 g fat  Progress Towards Goal(s):  In progress.   Nutritional Diagnosis:  NB-1.1 Food and nutrition-related knowledge deficit As related to Diabetes.  As evidenced by A1C 11,4%.    Intervention:  Nutrition and Diabetes education provided on My Plate, CHO counting, meal planning, portion sizes, timing of meals, avoiding snacks between meals unless having a low blood sugar, target ranges for A1C and blood sugars, signs/symptoms and treatment of hyper/hypoglycemia, monitoring blood sugars, taking medications as prescribed, benefits of exercising 30 minutes per day and prevention of complications of DM.  Goals 1. Follow My Plate Method 2. Eat 2-3 carb choices per meal. 3. Increase low carb vegetables daily. Eat yogurt daily to prevent yeast infections. 4. Portion foods out.. 5. Drink only water 6.. Get A1C down to 7% in three months. 7.  Exercise 30 minutes daily. 8.   Lose 1-2  lbs per  month  Teaching Method Utilized:  Visual Auditory Hands on  Handouts given during visit include:  The Plate Method   Meal Plan Card  Diabetes Instructions.   Barriers to learning/adherence to lifestyle change:  None  Demonstrated degree of understanding via:  Teach Back   Monitoring/Evaluation:  Dietary intake, exercise, meal planning, SBG, and body weight in 3 month(s).

## 2016-05-05 NOTE — Progress Notes (Signed)
Subjective:    Patient ID: Robyn Crawford, female    DOB: Dec 20, 1945. Patient is Here to follow-up for management of diabetes requested by   Altamease Oiler, FNP  Past Medical History:  Diagnosis Date  . Diabetes mellitus, type II (HCC)   . Hypertension    Past Surgical History:  Procedure Laterality Date  . ABDOMINAL HYSTERECTOMY    . CORONARY ANGIOPLASTY WITH STENT PLACEMENT     Social History   Social History  . Marital status: Widowed    Spouse name: N/A  . Number of children: N/A  . Years of education: N/A   Social History Main Topics  . Smoking status: Former Games developer  . Smokeless tobacco: None  . Alcohol use No  . Drug use: No  . Sexual activity: Not Asked   Other Topics Concern  . None   Social History Narrative  . None   Outpatient Encounter Prescriptions as of 05/05/2016  Medication Sig  . amLODipine (NORVASC) 10 MG tablet Take 1 tablet (10 mg total) by mouth daily.  Marland Kitchen atorvastatin (LIPITOR) 20 MG tablet Take 1 tablet (20 mg total) by mouth daily.  . canagliflozin (INVOKANA) 100 MG TABS tablet Take 1 tablet (100 mg total) by mouth daily before breakfast.  . clopidogrel (PLAVIX) 75 MG tablet Take 75 mg by mouth daily.  . hydrochlorothiazide (HYDRODIURIL) 12.5 MG tablet Take 1 tablet (12.5 mg total) by mouth daily.  . Insulin Glargine (LANTUS SOLOSTAR) 100 UNIT/ML Solostar Pen Inject 40 Units into the skin at bedtime.  Marland Kitchen lisinopril (PRINIVIL,ZESTRIL) 40 MG tablet Take 1 tablet (40 mg total) by mouth daily.  . metFORMIN (GLUCOPHAGE) 500 MG tablet Take 1 tablet (500 mg total) by mouth 2 (two) times daily with a meal.   No facility-administered encounter medications on file as of 05/05/2016.    ALLERGIES: Allergies  Allergen Reactions  . Codeine    VACCINATION STATUS:  There is no immunization history on file for this patient.  Diabetes  She presents for her follow-up diabetic visit. She has type 2 diabetes mellitus. Onset time: She was diagnosed at  approximate age of 51 years. Her disease course has been improving. There are no hypoglycemic associated symptoms. Pertinent negatives for hypoglycemia include no confusion, headaches, pallor or seizures. Associated symptoms include fatigue. Pertinent negatives for diabetes include no chest pain, no polydipsia, no polyphagia and no polyuria. There are no hypoglycemic complications. Symptoms are improving. Diabetic complications include heart disease. (Status post stent placement 3.) Risk factors for coronary artery disease include diabetes mellitus, dyslipidemia, hypertension, obesity, sedentary lifestyle and tobacco exposure. Current diabetic treatment includes insulin injections and oral agent (monotherapy) (She is on Lantus 60 units daily at bedtime and NovoLog 14 units 3 times a day with meals.). Her weight is decreasing steadily. She is following a generally unhealthy diet. When asked about meal planning, she reported none. She has had a previous visit with a dietitian. She never participates in exercise. Her breakfast blood glucose range is generally 130-140 mg/dl. Her dinner blood glucose range is generally 140-180 mg/dl. An ACE inhibitor/angiotensin II receptor blocker is being taken. Eye exam is current.  Hyperlipidemia  This is a chronic problem. The current episode started more than 1 year ago. Pertinent negatives include no chest pain, myalgias or shortness of breath. Current antihyperlipidemic treatment includes statins. Risk factors for coronary artery disease include dyslipidemia, diabetes mellitus, hypertension, obesity and a sedentary lifestyle.  Hypertension  This is a chronic problem. The current episode  started more than 1 year ago. Pertinent negatives include no chest pain, headaches, palpitations or shortness of breath. Past treatments include ACE inhibitors. Hypertensive end-organ damage includes CAD/MI.       Review of Systems  Constitutional: Positive for fatigue. Negative for  chills, fever and unexpected weight change.  HENT: Negative for trouble swallowing and voice change.   Eyes: Negative for visual disturbance.  Respiratory: Negative for cough, shortness of breath and wheezing.   Cardiovascular: Negative for chest pain, palpitations and leg swelling.  Gastrointestinal: Negative for diarrhea, nausea and vomiting.  Endocrine: Negative for cold intolerance, heat intolerance, polydipsia, polyphagia and polyuria.  Musculoskeletal: Negative for arthralgias and myalgias.  Skin: Negative for color change, pallor, rash and wound.  Neurological: Negative for seizures and headaches.  Psychiatric/Behavioral: Negative for confusion and suicidal ideas.    Objective:    BP 136/82   Pulse 90   Resp 18   Ht  (1.651 m)   Wt 186 lb (84.4 kg)   SpO2 92%   BMI 30.95 kg/m   Wt Readings from Last 3 Encounters:  05/05/16 186 lb (84.4 kg)  05/05/16 185 lb 9.6 oz (84.2 kg)  02/03/16 197 lb (89.4 kg)    Physical Exam  Constitutional: She is oriented to person, place, and time. She appears well-developed.  HENT:  Head: Normocephalic and atraumatic.  Grossly deficient dental condition. She has a dentist who is working on her teeth.   Eyes: EOM are normal.  Neck: Normal range of motion. Neck supple. No tracheal deviation present. No thyromegaly present.  Cardiovascular: Normal rate and regular rhythm.   Pulmonary/Chest: Effort normal and breath sounds normal.  Abdominal: Soft. Bowel sounds are normal. There is no tenderness. There is no guarding.  Musculoskeletal: Normal range of motion. She exhibits no edema.  Neurological: She is alert and oriented to person, place, and time. She has normal reflexes. No cranial nerve deficit. Coordination normal.  Skin: Skin is warm and dry. No rash noted. No erythema. No pallor.  Psychiatric: She has a normal mood and affect. Judgment normal.    Assessment & Plan:   1. Uncontrolled type 2 diabetes mellitus with stage 2  chronic kidney disease,  coronary artery disease status post stent placement, with long-term current use of insulin (HCC)   - Patient has currently uncontrolled symptomatic type 2 DM since  70 years of age,  with most recent A1c of 8% improving from 11.4 %. Recent labs reviewed, showing  improving renal function.    - Her diabetes is complicated , and she  remains at a high risk for more acute and chronic complications of diabetes which include CAD, CVA, CKD, retinopathy, and neuropathy. These are all discussed in detail with the patient.  - I have counseled the patient on diet management and weight loss, by adopting a carbohydrate restricted/protein rich diet.  - Suggestion is made for patient to avoid simple carbohydrates   from their diet including Cakes , Desserts, Ice Cream,  Soda (  diet and regular) , Sweet Tea , Candies,  Chips, Cookies, Artificial Sweeteners,   and "Sugar-free" Products . This will help patient to have stable blood glucose profile and potentially avoid unintended weight gain.  - I encouraged the patient to switch to  unprocessed or minimally processed complex starch and increased protein intake (animal or plant source), fruits, and vegetables.  - Patient is advised to stick to a routine mealtimes to eat 3 meals  a day and avoid unnecessary  snacks ( to snack only to correct hypoglycemia).  - The patient will be scheduled with Norm SaltPenny Crumpton, RDN, CDE for individualized DM education.  - I have approached patient with the following individualized plan to manage diabetes and patient agrees:   - She came with significantly improved glycemic profile. I  will proceed to readjust  basal insulin Lantus 40  units QHS, and  hold prandial insulin for now. -Continue  monitoring of glucose  AC  breakfast and HS. - Patient is warned not to take insulin without proper monitoring per orders.  -Patient is encouraged to call clinic for blood glucose levels less than 70 or above 300 mg  /dl. - I will continue metformin low-dose at 500 mg by mouth twice a day, therapeutically suitable for patient. -I will ontinue Invokana 100 mg by mouth every morning. Side effects and precautions discussed with her. -She is given patient assistance form to fill up for [employer], she complains of cost of medications. - Patient will be considered for incretin therapy as appropriate next visit. - Patient specific target  A1c;  LDL, HDL, Triglycerides, and  Waist Circumference were discussed in detail.  2) BP/HTN: uncontrolled. Continue current medications including ACEI/ARB. 3) Lipids/HPL:   continue statins. 4)  Weight/Diet: CDE Consult will be initiated , exercise, and detailed carbohydrates information provided.  5) Chronic Care/Health Maintenance:  -Patient is on ACEI/ARB and Statin medications and encouraged to continue to follow up with Ophthalmology, Podiatrist at least yearly or according to recommendations, and advised to   stay away from smoking. I have recommended yearly flu vaccine and pneumonia vaccination at least every 5 years; moderate intensity exercise for up to 150 minutes weekly; and  sleep for at least 7 hours a day.  - 25 minutes of time was spent on the care of this patient , 50% of which was applied for counseling on diabetes complications and their preventions.  - Patient to bring meter and  blood glucose logs during their next visit.   - I advised patient to maintain close follow up with Altamease OilerHARRIS,MEREDITH L, FNP for primary care needs.  Follow up plan: - Return in about 3 months (around 08/05/2016) for follow up with pre-visit labs, meter, and logs.  Marquis LunchGebre Jlynn Ly, MD Phone: [phone]  Fax: [phone]   05/05/2016, 9:13 AM

## 2016-05-05 NOTE — Patient Instructions (Signed)
Goals 1. Follow My Plate Method 2. Eat 2-3 carb choices per meal. 3. Increase low carb vegetables daily. Eat yogurt daily to prevent yeast infections. 4. Portion foods out.. 5. Drink only water 6.. Get A1C down to 7% in three months. 7.  Exercise 30 minutes daily. 8.   Lose 1-2  lbs per month

## 2016-06-17 ENCOUNTER — Other Ambulatory Visit: Payer: Self-pay

## 2016-06-17 MED ORDER — CANAGLIFLOZIN 100 MG PO TABS: 100.0000 mg | ORAL_TABLET | Freq: Every day | ORAL | 3 refills | Status: DC

## 2016-07-04 ENCOUNTER — Other Ambulatory Visit: Payer: Self-pay | Admitting: "Endocrinology

## 2016-08-12 ENCOUNTER — Ambulatory Visit: Payer: Medicare Other | Admitting: Nutrition

## 2016-08-12 ENCOUNTER — Ambulatory Visit: Payer: Medicare Other | Admitting: "Endocrinology

## 2016-08-15 ENCOUNTER — Ambulatory Visit: Payer: Medicare Other | Admitting: Nutrition

## 2016-10-14 ENCOUNTER — Other Ambulatory Visit: Payer: Self-pay | Admitting: "Endocrinology

## 2016-10-31 ENCOUNTER — Other Ambulatory Visit (HOSPITAL_COMMUNITY): Payer: Self-pay | Admitting: Nephrology

## 2016-10-31 DIAGNOSIS — N183 Chronic kidney disease, stage 3 unspecified: Secondary | ICD-10-CM

## 2016-11-16 ENCOUNTER — Other Ambulatory Visit: Payer: Self-pay | Admitting: "Endocrinology

## 2016-12-08 ENCOUNTER — Ambulatory Visit (HOSPITAL_COMMUNITY)
Admission: RE | Admit: 2016-12-08 | Discharge: 2016-12-08 | Disposition: A | Discharge status: Home / Self Care | Payer: Medicare Other | Source: Ambulatory Visit | Attending: Nephrology | Admitting: Nephrology

## 2016-12-08 DIAGNOSIS — N183 Chronic kidney disease, stage 3 unspecified: Secondary | ICD-10-CM

## 2017-02-17 ENCOUNTER — Encounter: Payer: Self-pay | Admitting: "Endocrinology

## 2017-02-17 LAB — HM DIABETES EYE EXAM

## 2021-04-19 ENCOUNTER — Emergency Department (HOSPITAL_COMMUNITY): Payer: Medicare HMO

## 2021-04-19 ENCOUNTER — Encounter (HOSPITAL_COMMUNITY): Payer: Self-pay

## 2021-04-19 ENCOUNTER — Observation Stay (HOSPITAL_COMMUNITY)
Admission: EM | Admit: 2021-04-19 | Discharge: 2021-04-21 | Disposition: A | Discharge status: Home / Self Care | Payer: Medicare HMO | Attending: Family Medicine | Admitting: Family Medicine

## 2021-04-19 ENCOUNTER — Other Ambulatory Visit: Payer: Self-pay

## 2021-04-19 DIAGNOSIS — I10 Essential (primary) hypertension: Secondary | ICD-10-CM | POA: Diagnosis not present

## 2021-04-19 DIAGNOSIS — Z20822 Contact with and (suspected) exposure to covid-19: Secondary | ICD-10-CM | POA: Diagnosis not present

## 2021-04-19 DIAGNOSIS — Z7984 Long term (current) use of oral hypoglycemic drugs: Secondary | ICD-10-CM | POA: Diagnosis not present

## 2021-04-19 DIAGNOSIS — S2241XA Multiple fractures of ribs, right side, initial encounter for closed fracture: Secondary | ICD-10-CM | POA: Diagnosis not present

## 2021-04-19 DIAGNOSIS — S270XXA Traumatic pneumothorax, initial encounter: Secondary | ICD-10-CM | POA: Insufficient documentation

## 2021-04-19 DIAGNOSIS — Z79899 Other long term (current) drug therapy: Secondary | ICD-10-CM | POA: Diagnosis not present

## 2021-04-19 DIAGNOSIS — Z87891 Personal history of nicotine dependence: Secondary | ICD-10-CM | POA: Diagnosis not present

## 2021-04-19 DIAGNOSIS — E119 Type 2 diabetes mellitus without complications: Secondary | ICD-10-CM | POA: Insufficient documentation

## 2021-04-19 DIAGNOSIS — W19XXXA Unspecified fall, initial encounter: Secondary | ICD-10-CM | POA: Diagnosis present

## 2021-04-19 DIAGNOSIS — S299XXA Unspecified injury of thorax, initial encounter: Secondary | ICD-10-CM | POA: Diagnosis present

## 2021-04-19 DIAGNOSIS — W06XXXA Fall from bed, initial encounter: Secondary | ICD-10-CM | POA: Diagnosis not present

## 2021-04-19 DIAGNOSIS — S2239XA Fracture of one rib, unspecified side, initial encounter for closed fracture: Secondary | ICD-10-CM

## 2021-04-19 DIAGNOSIS — E1159 Type 2 diabetes mellitus with other circulatory complications: Secondary | ICD-10-CM | POA: Diagnosis present

## 2021-04-19 DIAGNOSIS — Z794 Long term (current) use of insulin: Secondary | ICD-10-CM | POA: Insufficient documentation

## 2021-04-19 DIAGNOSIS — J939 Pneumothorax, unspecified: Secondary | ICD-10-CM | POA: Diagnosis present

## 2021-04-19 DIAGNOSIS — R0789 Other chest pain: Secondary | ICD-10-CM

## 2021-04-19 LAB — GLUCOSE, CAPILLARY: Glucose-Capillary: 116 mg/dL — ABNORMAL HIGH (ref 70–99)

## 2021-04-19 LAB — CBC WITH DIFFERENTIAL/PLATELET
Abs Immature Granulocytes: 0.01 10*3/uL (ref 0.00–0.07)
Basophils Absolute: 0 10*3/uL (ref 0.0–0.1)
Basophils Relative: 0 %
Eosinophils Absolute: 0.2 10*3/uL (ref 0.0–0.5)
Eosinophils Relative: 2 %
HCT: 41.7 % (ref 36.0–46.0)
Hemoglobin: 13.1 g/dL (ref 12.0–15.0)
Immature Granulocytes: 0 %
Lymphocytes Relative: 43 %
Lymphs Abs: 3.5 10*3/uL (ref 0.7–4.0)
MCH: 28.7 pg (ref 26.0–34.0)
MCHC: 31.4 g/dL (ref 30.0–36.0)
MCV: 91.4 fL (ref 80.0–100.0)
Monocytes Absolute: 0.8 10*3/uL (ref 0.1–1.0)
Monocytes Relative: 10 %
Neutro Abs: 3.6 10*3/uL (ref 1.7–7.7)
Neutrophils Relative %: 45 %
Platelets: 260 10*3/uL (ref 150–400)
RBC: 4.56 MIL/uL (ref 3.87–5.11)
RDW: 14.4 % (ref 11.5–15.5)
WBC: 8 10*3/uL (ref 4.0–10.5)
nRBC: 0 % (ref 0.0–0.2)

## 2021-04-19 LAB — BASIC METABOLIC PANEL
Anion gap: 7 (ref 5–15)
BUN: 29 mg/dL — ABNORMAL HIGH (ref 8–23)
CO2: 30 mmol/L (ref 22–32)
Calcium: 9.4 mg/dL (ref 8.9–10.3)
Chloride: 102 mmol/L (ref 98–111)
Creatinine, Ser: 1.23 mg/dL — ABNORMAL HIGH (ref 0.44–1.00)
GFR, Estimated: 46 mL/min — ABNORMAL LOW (ref >60)
Glucose, Bld: 98 mg/dL (ref 70–99)
Potassium: 3.6 mmol/L (ref 3.5–5.1)
Sodium: 139 mmol/L (ref 135–145)

## 2021-04-19 LAB — RESP PANEL BY RT-PCR (FLU A&B, COVID) ARPGX2
Influenza A by PCR: NEGATIVE
Influenza B by PCR: NEGATIVE
SARS Coronavirus 2 by RT PCR: NEGATIVE

## 2021-04-19 MED ORDER — TRAMADOL HCL 50 MG PO TABS
50.0000 mg | ORAL_TABLET | Freq: Once | ORAL | Status: AC
  Administered 2021-04-19: 50 mg via ORAL
  Filled 2021-04-19: qty 1

## 2021-04-19 MED ORDER — ACETAMINOPHEN 650 MG RE SUPP: 650.0000 mg | Freq: Four times a day (QID) | RECTAL | Status: DC | PRN

## 2021-04-19 MED ORDER — LISINOPRIL 10 MG PO TABS
40.0000 mg | ORAL_TABLET | Freq: Every day | ORAL | Status: DC
  Administered 2021-04-21: 40 mg via ORAL
  Filled 2021-04-19 (×2): qty 4

## 2021-04-19 MED ORDER — AMLODIPINE BESYLATE 5 MG PO TABS
10.0000 mg | ORAL_TABLET | Freq: Every day | ORAL | Status: DC
  Administered 2021-04-19 – 2021-04-21 (×2): 10 mg via ORAL
  Filled 2021-04-19 (×3): qty 2

## 2021-04-19 MED ORDER — HYDROCODONE-ACETAMINOPHEN 5-325 MG PO TABS
1.0000 | ORAL_TABLET | Freq: Four times a day (QID) | ORAL | Status: DC | PRN
  Administered 2021-04-20 (×2): 1 via ORAL
  Filled 2021-04-19 (×2): qty 1

## 2021-04-19 MED ORDER — CLOPIDOGREL BISULFATE 75 MG PO TABS
75.0000 mg | ORAL_TABLET | Freq: Every day | ORAL | Status: DC
  Administered 2021-04-20 – 2021-04-21 (×2): 75 mg via ORAL
  Filled 2021-04-19 (×2): qty 1

## 2021-04-19 MED ORDER — INSULIN ASPART 100 UNIT/ML IJ SOLN
0.0000 [IU] | Freq: Three times a day (TID) | INTRAMUSCULAR | Status: DC
  Administered 2021-04-20: 5 [IU] via SUBCUTANEOUS
  Administered 2021-04-20 – 2021-04-21 (×2): 3 [IU] via SUBCUTANEOUS
  Administered 2021-04-21: 2 [IU] via SUBCUTANEOUS

## 2021-04-19 MED ORDER — ONDANSETRON HCL 4 MG/2ML IJ SOLN: 4.0000 mg | Freq: Four times a day (QID) | INTRAMUSCULAR | Status: DC | PRN

## 2021-04-19 MED ORDER — ACETAMINOPHEN 325 MG PO TABS: 650.0000 mg | ORAL_TABLET | Freq: Four times a day (QID) | ORAL | Status: DC | PRN

## 2021-04-19 MED ORDER — POLYETHYLENE GLYCOL 3350 17 G PO PACK: 17.0000 g | PACK | Freq: Every day | ORAL | Status: DC | PRN

## 2021-04-19 MED ORDER — ONDANSETRON HCL 4 MG PO TABS
4.0000 mg | ORAL_TABLET | Freq: Four times a day (QID) | ORAL | Status: DC | PRN
  Administered 2021-04-20: 4 mg via ORAL
  Filled 2021-04-19: qty 1

## 2021-04-19 MED ORDER — HYDROCHLOROTHIAZIDE 25 MG PO TABS
12.5000 mg | ORAL_TABLET | Freq: Every day | ORAL | Status: DC
  Administered 2021-04-20 – 2021-04-21 (×2): 12.5 mg via ORAL
  Filled 2021-04-19 (×2): qty 1

## 2021-04-19 MED ORDER — INSULIN ASPART 100 UNIT/ML IJ SOLN
0.0000 [IU] | Freq: Every day | INTRAMUSCULAR | Status: DC
Start: 2021-04-19 — End: 2021-04-21

## 2021-04-19 MED ORDER — ATORVASTATIN CALCIUM 20 MG PO TABS
20.0000 mg | ORAL_TABLET | Freq: Every day | ORAL | Status: DC
  Administered 2021-04-20 – 2021-04-21 (×2): 20 mg via ORAL
  Filled 2021-04-19 (×2): qty 1

## 2021-04-19 NOTE — ED Triage Notes (Signed)
Pt states she feel out bed Saturday and now has rib pain and back pain right side.  Pt alert and oriented pain 10/10.  Pt feels her BS dropped and she fell out of bed

## 2021-04-19 NOTE — ED Notes (Signed)
Patient transported to X-ray 

## 2021-04-19 NOTE — ED Provider Notes (Addendum)
[facility] EMERGENCY DEPARTMENT Provider Note   CSN: [id number] Arrival date & time: 04/19/21  1937     History Chief Complaint  Patient presents with   Back Pain    Back and Rib pain from fall Sat.    Robyn Crawford is a 75 y.o. female.  Patient on Saturday night had a fall when getting out of bed.  Not exactly sure what she struck.  But has had pain up under the right lateral chest wall area since that time.  Made worse with moving.  Made worse with moving her arm.  Denies any hip pain leg pain any neck pain back pain states she did not hit her head.  Past medical history significant for diabetes hyperlipidemia hypertension.  Patient's been taken Tylenol for it but its not helping.  Patient thinks she fell because her blood sugar got too low.  No problems with any falls since then.      Past Medical History:  Diagnosis Date   Diabetes mellitus, type II (HCC)    Hypertension     Patient Active Problem List   Diagnosis Date Noted   Morbid obesity due to excess calories (HCC) 11/30/2015   DM type 2 causing vascular disease (HCC) 11/19/2015   Uncontrolled type 2 diabetes mellitus with stage 2 chronic kidney disease, with long-term current use of insulin (HCC) 11/19/2015   Hyperlipidemia 11/19/2015   Essential hypertension, benign 11/19/2015    Past Surgical History:  Procedure Laterality Date   ABDOMINAL HYSTERECTOMY     CORONARY ANGIOPLASTY WITH STENT PLACEMENT       OB History   No obstetric history on file.     Family History  Problem Relation Age of Onset   Diabetes Father    Diabetes Sister    Diabetes Brother     Social History   Tobacco Use   Smoking status: Former  Substance Use Topics   Alcohol use: No    Alcohol/week: 0.0 standard drinks   Drug use: No    Home Medications Prior to Admission medications   Medication Sig Start Date End Date Taking? Authorizing Provider  amLODipine (NORVASC) 10 MG tablet TAKE 1 TABLET BY MOUTH  DAILY 07/04/16    Roma Kayser, MD  atorvastatin (LIPITOR) 20 MG tablet TAKE 1 TABLET BY MOUTH  DAILY 07/04/16   Roma Kayser, MD  canagliflozin (INVOKANA) 100 MG TABS tablet Take 1 tablet (100 mg total) by mouth daily before breakfast. 06/17/16   Nida, Denman George, MD  clopidogrel (PLAVIX) 75 MG tablet Take 75 mg by mouth daily.    [provider]  hydrochlorothiazide (HYDRODIURIL) 12.5 MG tablet TAKE 1 TABLET BY MOUTH  DAILY 07/04/16   Nida, Denman George, MD  Insulin Glargine (LANTUS SOLOSTAR) 100 UNIT/ML Solostar Pen Inject 40 Units into the skin at bedtime. 02/03/16   Roma Kayser, MD  lisinopril (PRINIVIL,ZESTRIL) 40 MG tablet TAKE 1 TABLET BY MOUTH  DAILY 07/04/16   Roma Kayser, MD  metFORMIN (GLUCOPHAGE) 500 MG tablet TAKE 1 TABLET BY MOUTH TWO  TIMES DAILY WITH A MEAL 07/04/16   Roma Kayser, MD    Allergies    Codeine  Review of Systems   Review of Systems  Constitutional:  Negative for chills and fever.  HENT:  Negative for ear pain and sore throat.   Eyes:  Negative for pain and visual disturbance.  Respiratory:  Negative for cough and shortness of breath.   Cardiovascular:  Positive for  chest pain. Negative for palpitations.  Gastrointestinal:  Negative for abdominal pain and vomiting.  Genitourinary:  Negative for dysuria and hematuria.  Musculoskeletal:  Negative for arthralgias and back pain.  Skin:  Negative for color change and rash.  Neurological:  Negative for seizures and syncope.  All other systems reviewed and are negative.  Physical Exam Updated Vital Signs BP (!) 150/88   Pulse 69   Temp 97.7 F (36.5 C) (Tympanic)   Resp 15   Ht 1.651 m (5\' 5" )   Wt 88 kg   SpO2 99%   BMI 32.28 kg/m   Physical Exam Vitals and nursing note reviewed.  Constitutional:      General: She is not in acute distress.    Appearance: Normal appearance. She is well-developed.  HENT:     Head: Normocephalic and atraumatic.  Eyes:      Conjunctiva/sclera: Conjunctivae normal.     Pupils: Pupils are equal, round, and reactive to light.  Cardiovascular:     Rate and Rhythm: Normal rate and regular rhythm.     Heart sounds: No murmur heard. Pulmonary:     Effort: Pulmonary effort is normal. No respiratory distress.     Breath sounds: Normal breath sounds.     Comments: Tender to palpation up around the fifth rib area in the axillary area on the right side. Chest:     Chest wall: Tenderness present.  Abdominal:     Palpations: Abdomen is soft.     Tenderness: There is no abdominal tenderness.  Musculoskeletal:        General: Normal range of motion.     Cervical back: Neck supple.  Skin:    General: Skin is warm and dry.  Neurological:     General: No focal deficit present.     Mental Status: She is alert and oriented to person, place, and time.    ED Results / Procedures / Treatments   Labs (all labs ordered are listed, but only abnormal results are displayed) Labs Reviewed - No data to display  EKG None  Radiology DG Ribs Unilateral W/Chest Right  Result Date: 04/19/2021 CLINICAL DATA:  Posterior right-sided rib pain after fall EXAM: RIGHT RIBS AND CHEST - 3+ VIEW COMPARISON:  11/13/2005 FINDINGS: Nondisplaced fractures of the posterolateral aspects of the right fourth, fifth, and sixth ribs. Suspect tiny right lateral pneumothorax adjacent to the rib fracture sites. No pleural effusion. Both lungs are clear. Heart size and mediastinal contours are within normal limits. Atherosclerotic calcification of the aortic knob. IMPRESSION: Nondisplaced fractures of the posterolateral aspects of the right fourth, fifth, and sixth ribs. Suspect tiny right lateral pneumothorax. Electronically Signed   By: 11/15/2005 D.O.   On: 04/19/2021 14:18    Procedures Procedures   Medications Ordered in ED Medications - No data to display  ED Course  I have reviewed the triage vital signs and the nursing  notes.  Pertinent labs & imaging results that were available during my care of the patient were reviewed by me and considered in my medical decision making (see chart for details).    MDM Rules/Calculators/A&P                           Suggestive of rib contusion perhaps rib fracture or just a pulled muscle in the area or chest wall tenderness.  Chest x-ray with right rib series is pending.  Patient denies any other injuries from the fall.  Fall occurred on Saturday night.   Final Clinical Impression(s) / ED Diagnoses Final diagnoses:  Fall, initial encounter  Chest wall pain  Closed fracture of multiple ribs of right side, initial encounter    Rx / DC Orders ED Discharge Orders     None        Vanetta Mulders, MD 04/19/21 1422  Chest x-ray rib series shows 3 right-sided rib fractures.  Questionable small pneumothorax.  Will get CT chest without contrast to delineate that further.  Suspect patient will be able to go home with pain control.    Vanetta Mulders, MD 04/19/21 1449  CT scan shows just 2 rib fractures.  But does show a small pneumothorax less than 5%.  Will discuss with general surgery about admission versus discharge home.  Patient does live by herself.    Vanetta Mulders, MD 04/19/21 1654  Dr. Henreitta Leber recommends bring her in repeating chest x-ray in the morning and make sure she responds well to pain medication since she lives by herself.  Will discuss with hospitalist.    Vanetta Mulders, MD 04/19/21 [phone]

## 2021-04-19 NOTE — H&P (Addendum)
History and Physical    Robyn Crawford OVF:[id number] DOB: May 11, 1946 DOA: 04/19/2021  PCP: [name], MD   Patient coming from: Home  I have personally briefly reviewed patient's old medical records in [facility]  Chief Complaint: Fall  HPI: Robyn Crawford is a 75 y.o. female with medical history significant for  DM, HTN, who presented to the ED with complaints of a fall that happened on Saturday night, 2 nights ago. Patient reports she just rolled off her bed onto the floor, hitting the right side of her chest on the floor.  She does not think she hit her head, she did not lose consciousness, has no headache, no change in vision, no difficulty with walking.  Patient reports she was on the floor for about 30 to 40 minutes before she was gradually able to pull herself off the floor using the bed for support. Since fall she has had persistent right upper chest pain, which restricts her breathing a little bit. Patient lives alone.  Denies frequent falls. Patient reports when she was on the floor she felt hot, and felt that her blood sugar was low, she had taken her insulin previously and had not eaten well that evening.    ED Course: Stable vitals.  BMP CBC pending.  Chest x-ray showed nondisplaced fractures of the right fourth fifth sixth ribs with suspected tiny right lateral pneumothorax.  Subsequent chest CT-acute fractures involving the right fourth and fifth ribs, tiny right-sided pneumothorax less than 5%. EDP talked to general surgeon Dr. Henreitta Leber, recommended admission overnight as patient lives alone.  Repeat chest x-ray in the morning.  Review of Systems: As per HPI all other systems reviewed and negative.  Past Medical History:  Diagnosis Date   Diabetes mellitus, type II (HCC)    Hypertension     Past Surgical History:  Procedure Laterality Date   ABDOMINAL HYSTERECTOMY     CORONARY ANGIOPLASTY WITH STENT PLACEMENT       reports that she has quit smoking. She  does not have any smokeless tobacco history on file. She reports that she does not drink alcohol and does not use drugs.  Allergies  Allergen Reactions   Codeine     Family History  Problem Relation Age of Onset   Diabetes Father    Diabetes Sister    Diabetes Brother     Prior to Admission medications   Medication Sig Start Date End Date Taking? Authorizing Provider  amLODipine (NORVASC) 10 MG tablet TAKE 1 TABLET BY MOUTH  DAILY 07/04/16   Roma Kayser, MD  atorvastatin (LIPITOR) 20 MG tablet TAKE 1 TABLET BY MOUTH  DAILY 07/04/16   Roma Kayser, MD  canagliflozin (INVOKANA) 100 MG TABS tablet Take 1 tablet (100 mg total) by mouth daily before breakfast. 06/17/16   Nida, Denman George, MD  clopidogrel (PLAVIX) 75 MG tablet Take 75 mg by mouth daily.    [provider]  hydrochlorothiazide (HYDRODIURIL) 12.5 MG tablet TAKE 1 TABLET BY MOUTH  DAILY 07/04/16   Nida, Denman George, MD  Insulin Glargine (LANTUS SOLOSTAR) 100 UNIT/ML Solostar Pen Inject 40 Units into the skin at bedtime. 02/03/16   Roma Kayser, MD  lisinopril (PRINIVIL,ZESTRIL) 40 MG tablet TAKE 1 TABLET BY MOUTH  DAILY 07/04/16   Roma Kayser, MD  metFORMIN (GLUCOPHAGE) 500 MG tablet TAKE 1 TABLET BY MOUTH TWO  TIMES DAILY WITH A MEAL 07/04/16   Nida, Denman George, MD    Physical  Exam: Vitals:   04/19/21 1230 04/19/21 1330 04/19/21 1503 04/19/21 1620  BP: 136/74 (!) 150/88 (!) 114/55 (!) 127/59  Pulse: 66 69 70 66  Resp: 16 15 16 18   Temp:      TempSrc:      SpO2: 97% 99% 98% 97%  Weight:      Height:        Constitutional: NAD, calm, comfortable Vitals:   04/19/21 1230 04/19/21 1330 04/19/21 1503 04/19/21 1620  BP: 136/74 (!) 150/88 (!) 114/55 (!) 127/59  Pulse: 66 69 70 66  Resp: 16 15 16 18   Temp:      TempSrc:      SpO2: 97% 99% 98% 97%  Weight:      Height:       Eyes: PERRL, lids and conjunctivae normal ENMT: Mucous membranes are moist.  Neck:  normal, supple, no masses, no thyromegaly Respiratory: clear to auscultation bilaterally, no wheezing, no crackles. Normal respiratory effort. No accessory muscle use.  Cardiovascular: Regular rate and rhythm, no murmurs / rubs / gallops. No extremity edema. 2+ pedal pulses. .  Abdomen: no tenderness, no masses palpated. No hepatosplenomegaly. Bowel sounds positive.  Musculoskeletal: Pain to right upper chest restricting movement of right shoulder, Skin: no rashes, lesions, ulcers. No induration Neurologic: No apparent cranial nerve abnormality,. Strength 5/5 in all 4.  Psychiatric: Normal judgment and insight. Alert and oriented x 3. Normal mood.   Labs on Admission: I have personally reviewed following labs and imaging studies  Radiological Exams on Admission: DG Ribs Unilateral W/Chest Right  Result Date: 04/19/2021 CLINICAL DATA:  Posterior right-sided rib pain after fall EXAM: RIGHT RIBS AND CHEST - 3+ VIEW COMPARISON:  11/13/2005 FINDINGS: Nondisplaced fractures of the posterolateral aspects of the right fourth, fifth, and sixth ribs. Suspect tiny right lateral pneumothorax adjacent to the rib fracture sites. No pleural effusion. Both lungs are clear. Heart size and mediastinal contours are within normal limits. Atherosclerotic calcification of the aortic knob. IMPRESSION: Nondisplaced fractures of the posterolateral aspects of the right fourth, fifth, and sixth ribs. Suspect tiny right lateral pneumothorax. Electronically Signed   By: 04/21/2021 D.O.   On: 04/19/2021 14:18   CT Chest Wo Contrast  Result Date: 04/19/2021 CLINICAL DATA:  Rib fracture suspected, traumatic Chest trauma, mod-severe EXAM: CT CHEST WITHOUT CONTRAST TECHNIQUE: Multidetector CT imaging of the chest was performed following the standard protocol without IV contrast. COMPARISON:  Same-day x-ray FINDINGS: Cardiovascular: Heart size within normal limits. No pericardial effusion. Atherosclerotic calcifications of the  aorta and coronary arteries. No thoracic aortic aneurysm. Mediastinum/Nodes: No axillary or mediastinal lymphadenopathy identified. Evaluation of the hilar structures is limited in the absence of intravenous contrast. Within this limitation, no obvious hilar adenopathy or mass is identified. The left thyroid lobe is enlarged and heterogeneous. Trachea and esophagus within normal limits. Lungs/Pleura: Tiny right-sided pneumothorax (less than 5% volume). No evidence of pulmonary contusion or laceration. Lungs are otherwise clear. No pleural effusion. Upper Abdomen: No acute abnormality. Musculoskeletal: Acute fractures involving the lateral aspects of the right fourth and fifth ribs without significant displacement. Remaining osseous structures appear intact. Thoracic vertebral body heights and alignment are maintained. No chest wall abnormality identified. IMPRESSION: 1. Acute fractures involving the right fourth and fifth ribs. 2. Tiny right-sided pneumothorax (less than 5% volume). 3. Enlarged and heterogeneous left thyroid lobe. Recommend thyroid ultrasound (ref: J Am Coll Radiol. 2015 Feb;12(2): 143-50). 4. Aortic and coronary artery atherosclerosis (ICD10-I70.0). Electronically Signed   By: 04/21/2021  Plundo D.O.   On: 04/19/2021 15:52    EKG: None.   Assessment/Plan Principal Problem:   Fall Active Problems:   Pneumothorax   DM type 2 causing vascular disease (HCC)   Essential hypertension, benign  Fall with right fourth, fifth rib fractures and tiny pneumothorax-fall 2 days ago, no focal neurologic deficits hence deferred head CT today.  No frequent falls. - EDP talked to general surgery Dr. Henreitta Leber recommended admission for observation -Repeat chest x-ray in the morning -Oxycodone acetaminophen as needed  Diabetes mellitus-random glucose 98. - HgbA1c - SSI- S -Hold home Lantus 50 units nightly for now  Hypertension-stable. -Med reconciliation pending, she confirms she takes lisinopril  and HCTZ both of which have been resumed.   DVT prophylaxis: SCDs for now, with rib fractures. Code Status: Full code Family Communication: None at bedside Disposition Plan: ~ 1 - 2 days Consults called: None Admission status: Obs tele  Onnie Boer MD [facility]  04/19/2021, 8:14 PM

## 2021-04-20 ENCOUNTER — Observation Stay (HOSPITAL_COMMUNITY): Payer: Medicare HMO

## 2021-04-20 DIAGNOSIS — S270XXA Traumatic pneumothorax, initial encounter: Secondary | ICD-10-CM | POA: Diagnosis not present

## 2021-04-20 DIAGNOSIS — I10 Essential (primary) hypertension: Secondary | ICD-10-CM

## 2021-04-20 DIAGNOSIS — E1159 Type 2 diabetes mellitus with other circulatory complications: Secondary | ICD-10-CM | POA: Diagnosis not present

## 2021-04-20 DIAGNOSIS — W19XXXA Unspecified fall, initial encounter: Secondary | ICD-10-CM | POA: Diagnosis not present

## 2021-04-20 LAB — GLUCOSE, CAPILLARY
Glucose-Capillary: 77 mg/dL (ref 70–99)
Glucose-Capillary: 273 mg/dL — ABNORMAL HIGH (ref 70–99)
Glucose-Capillary: 225 mg/dL — ABNORMAL HIGH (ref 70–99)
Glucose-Capillary: 198 mg/dL — ABNORMAL HIGH (ref 70–99)

## 2021-04-20 LAB — HEMOGLOBIN A1C
Hgb A1c MFr Bld: 12.2 % — ABNORMAL HIGH (ref 4.8–5.6)
Mean Plasma Glucose: 303 mg/dL

## 2021-04-20 NOTE — Progress Notes (Signed)
PROGRESS NOTE    Patient: Robyn Crawford                            PCP: Robyn Robert, MD                    DOB: 1946-03-26            DOA: 04/19/2021 [id number]:[id number]             DOS: 04/20/2021, 12:14 PM   LOS: 0 days   Date of Service: The patient was seen and examined on 04/20/2021  Subjective:   The patient was seen and examined this morning. Stable at this time. Still complaining of pain and right-sided chest with exertion Denies any shortness of breath Otherwise no issues overnight . Satting 94% on room air  Brief Narrative:   Robyn Crawford is a 75 y.o. female with medical history significant for  DM, HTN, who presented to the ED with complaints of a fall that happened on Saturday night, 2 nights ago. Patient reports she just rolled off her bed onto the floor, hitting the right side of her chest on the floor.  She does not think she hit her head, she did not lose consciousness, has no headache, no change in vision, no difficulty with walking.  Patient reports she was on the floor for about 30 to 40 minutes before she was gradually able to pull herself off the floor using the bed for support. Since fall she has had persistent right upper chest pain, which restricts her breathing a little bit. Patient lives alone.  Denies frequent falls. Patient reports when she was on the floor she felt hot, and felt that her blood sugar was low, she had taken her insulin previously and had not eaten well that evening.     ED Course:  Stable vitals.  BMP CBC reviewed, within normal limits  chest x-ray showed nondisplaced fractures of the right fourth fifth sixth ribs with suspected tiny right lateral pneumothorax.   Chest CT-acute fractures involving the right fourth and fifth ribs, tiny right-sided pneumothorax less than 5%. EDP talked to general surgeon Dr. Henreitta Leber, recommended admission overnight as patient lives alone.       Assessment & Plan:   Principal Problem:   Fall Active  Problems:   DM type 2 causing vascular disease (HCC)   Essential hypertension, benign   Pneumothorax   Fall with right fourth, fifth rib fractures and tiny pneumothorax -fall Precaution, -Satting 94% on room air -PT OT evaluation -fall 2 days ago, no focal neurologic deficits hence deferred head CT today.  No frequent falls. - EDP talked to general surgery Dr. Henreitta Leber recommended admission for observation -Repeat chest x-ray in the morning -Oxycodone acetaminophen as needed -Encouraging incentive spirometer, -Monitoring O2 sat... With any changes anticipating repeating CT of chest to assess for pneumothorax -Chest x-ray reviewed   IMPRESSION: Stable right rib fractures. Stable very small loculated appearing lateral right pneumothorax.    Diabetes mellitus-random glucose 98. - HgbA1c -Checking her blood sugar q. ACH S, with SSI coverage -Hold home Lantus 50 units nightly for now   Hypertension- - stable. -Continue lisinopril and HCTZ   ------------------------------------------------------------------------------------------------------------------------------------------------- Cultures; None   Antimicrobials: None    Consultants: PT/OT   ------------------------------------------------------------------------------------------------------------------------------------------------  DVT prophylaxis:  SCD/Compression stockings Code Status:   Code Status: Full Code  Family Communication: No family member present at bedside- attempt will be made  to update daily The above findings and plan of care has been discussed with patient (and family)  in detail,  they expressed understanding and agreement of above. -Advance care planning has been discussed.   Admission status:   Status is: Observation  The patient remains OBS appropriate and will d/c before 2 midnights.  Dispo: The patient is from: Home              Anticipated d/c is to: Home              Patient  currently is not medically stable to d/c.   Difficult to place patient No      Level of care: Telemetry   Procedures:   No admission procedures for hospital encounter.    Antimicrobials:  Anti-infectives (From admission, onward)    None        Medication:   amLODipine  10 mg Oral Daily   atorvastatin  20 mg Oral Daily   clopidogrel  75 mg Oral Daily   hydrochlorothiazide  12.5 mg Oral Daily   insulin aspart  0-5 Units Subcutaneous QHS   insulin aspart  0-9 Units Subcutaneous TID WC   lisinopril  40 mg Oral Daily    acetaminophen **OR** acetaminophen, HYDROcodone-acetaminophen, ondansetron **OR** ondansetron (ZOFRAN) IV, polyethylene glycol   Objective:   Vitals:   04/19/21 2214 04/20/21 0215 04/20/21 0508 04/20/21 0958  BP: 139/64 129/60 115/69 (!) 119/55  Pulse: 64 78 65   Resp: 19 18 18    Temp: 98 F (36.7 C) 98.2 F (36.8 C) 98.1 F (36.7 C)   TempSrc: Oral Oral Oral   SpO2: 98% 96% 94%   Weight:      Height:        Intake/Output Summary (Last 24 hours) at 04/20/2021 1214 Last data filed at 04/20/2021 0854 Gross per 24 hour  Intake 352 ml  Output --  Net 352 ml   Filed Weights   04/19/21 1008  Weight: 88 kg     Examination:   Physical Exam  Constitution:  Alert, cooperative, no distress,  Appears calm and comfortable  Psychiatric: Normal and stable mood and affect, cognition intact,   HEENT: Normocephalic, PERRL, otherwise with in Normal limits  Chest: Right-sided tenderness chest symmetric Cardio vascular:  S1/S2, RRR, No murmure, No Rubs or Gallops  pulmonary: Clear to auscultation bilaterally, respirations unlabored, negative wheezes / crackles Abdomen: Soft, non-tender, non-distended, bowel sounds,no masses, no organomegaly Muscular skeletal: Limited exam - in bed, able to move all 4 extremities, Normal strength,  Neuro: CNII-XII intact. , normal motor and sensation, reflexes intact  Extremities: No pitting edema lower extremities,  +2 pulses  Skin: Dry, warm to touch, negative for any Rashes, No open wounds Wounds: per nursing documentation    ------------------------------------------------------------------------------------------------------------------------------------------    LABs:  CBC Latest Ref Rng & Units 04/19/2021  WBC 4.0 - 10.5 K/uL 8.0  Hemoglobin 12.0 - 15.0 g/dL 81.113.1  Hematocrit 91.436.0 - 46.0 % 41.7  Platelets 150 - 400 K/uL 260   CMP Latest Ref Rng & Units 04/19/2021 04/28/2016 01/26/2016  Glucose 70 - 99 mg/dL 98 97 82  BUN 8 - 23 mg/dL 78(G29(H) 20 16  Creatinine 0.44 - 1.00 mg/dL 9.56(O1.23(H) 1.30(Q1.15(H) 6.57(Q1.00(H)  Sodium 135 - 145 mmol/L 139 142 142  Potassium 3.5 - 5.1 mmol/L 3.6 4.3 4.3  Chloride 98 - 111 mmol/L 102 104 102  CO2 22 - 32 mmol/L 30 29 28   Calcium 8.9 - 10.3 mg/dL 9.4 46.910.3 62.910.1  Micro Results Recent Results (from the past 240 hour(s))  Resp Panel by RT-PCR (Flu A&B, Covid) Nasopharyngeal Swab     Status: None   Collection Time: 04/19/21  4:48 PM   Specimen: Nasopharyngeal Swab; Nasopharyngeal(NP) swabs in vial transport medium  Result Value Ref Range Status   SARS Coronavirus 2 by RT PCR NEGATIVE NEGATIVE Final    Comment: (NOTE) SARS-CoV-2 target nucleic acids are NOT DETECTED.  The SARS-CoV-2 RNA is generally detectable in upper respiratory specimens during the acute phase of infection. The lowest concentration of SARS-CoV-2 viral copies this assay can detect is 138 copies/mL. A negative result does not preclude SARS-Cov-2 infection and should not be used as the sole basis for treatment or other patient management decisions. A negative result may occur with  improper specimen collection/handling, submission of specimen other than nasopharyngeal swab, presence of viral mutation(s) within the areas targeted by this assay, and inadequate number of viral copies(<138 copies/mL). A negative result must be combined with clinical observations, patient history, and  epidemiological information. The expected result is Negative.  Fact Sheet for Patients:  BloggerCourse.com  Fact Sheet for Healthcare Providers:  SeriousBroker.it  This test is no t yet approved or cleared by the [place] FDA and  has been authorized for detection and/or diagnosis of SARS-CoV-2 by FDA under an Emergency Use Authorization (EUA). This EUA will remain  in effect (meaning this test can be used) for the duration of the COVID-19 declaration under Section 564(b)(1) of the Act, 21 U.S.C.section 360bbb-3(b)(1), unless the authorization is terminated  or revoked sooner.       Influenza A by PCR NEGATIVE NEGATIVE Final   Influenza B by PCR NEGATIVE NEGATIVE Final    Comment: (NOTE) The Xpert Xpress SARS-CoV-2/FLU/RSV plus assay is intended as an aid in the diagnosis of influenza from Nasopharyngeal swab specimens and should not be used as a sole basis for treatment. Nasal washings and aspirates are unacceptable for Xpert Xpress SARS-CoV-2/FLU/RSV testing.  Fact Sheet for Patients: BloggerCourse.com  Fact Sheet for Healthcare Providers: SeriousBroker.it  This test is not yet approved or cleared by the [place] FDA and has been authorized for detection and/or diagnosis of SARS-CoV-2 by FDA under an Emergency Use Authorization (EUA). This EUA will remain in effect (meaning this test can be used) for the duration of the COVID-19 declaration under Section 564(b)(1) of the Act, 21 U.S.C. section 360bbb-3(b)(1), unless the authorization is terminated or revoked.  Performed at [facility], [address], [place], [place] [address]     Radiology Reports DG Ribs Unilateral W/Chest Right  Result Date: 04/19/2021 CLINICAL DATA:  Posterior right-sided rib pain after fall EXAM: RIGHT RIBS AND CHEST - 3+ VIEW COMPARISON:  11/13/2005 FINDINGS: Nondisplaced fractures of  the posterolateral aspects of the right fourth, fifth, and sixth ribs. Suspect tiny right lateral pneumothorax adjacent to the rib fracture sites. No pleural effusion. Both lungs are clear. Heart size and mediastinal contours are within normal limits. Atherosclerotic calcification of the aortic knob. IMPRESSION: Nondisplaced fractures of the posterolateral aspects of the right fourth, fifth, and sixth ribs. Suspect tiny right lateral pneumothorax. Electronically Signed   By: Duanne Guess D.O.   On: 04/19/2021 14:18   CT Chest Wo Contrast  Result Date: 04/19/2021 CLINICAL DATA:  Rib fracture suspected, traumatic Chest trauma, mod-severe EXAM: CT CHEST WITHOUT CONTRAST TECHNIQUE: Multidetector CT imaging of the chest was performed following the standard protocol without IV contrast. COMPARISON:  Same-day x-ray FINDINGS: Cardiovascular: Heart size within  normal limits. No pericardial effusion. Atherosclerotic calcifications of the aorta and coronary arteries. No thoracic aortic aneurysm. Mediastinum/Nodes: No axillary or mediastinal lymphadenopathy identified. Evaluation of the hilar structures is limited in the absence of intravenous contrast. Within this limitation, no obvious hilar adenopathy or mass is identified. The left thyroid lobe is enlarged and heterogeneous. Trachea and esophagus within normal limits. Lungs/Pleura: Tiny right-sided pneumothorax (less than 5% volume). No evidence of pulmonary contusion or laceration. Lungs are otherwise clear. No pleural effusion. Upper Abdomen: No acute abnormality. Musculoskeletal: Acute fractures involving the lateral aspects of the right fourth and fifth ribs without significant displacement. Remaining osseous structures appear intact. Thoracic vertebral body heights and alignment are maintained. No chest wall abnormality identified. IMPRESSION: 1. Acute fractures involving the right fourth and fifth ribs. 2. Tiny right-sided pneumothorax (less than 5% volume).  3. Enlarged and heterogeneous left thyroid lobe. Recommend thyroid ultrasound (ref: J Am Coll Radiol. 2015 Feb;12(2): 143-50). 4. Aortic and coronary artery atherosclerosis (ICD10-I70.0). Electronically Signed   By: Duanne Guess D.O.   On: 04/19/2021 15:52   DG CHEST PORT 1 VIEW  Result Date: 04/20/2021 CLINICAL DATA:  Shortness of breath. EXAM: PORTABLE CHEST 1 VIEW COMPARISON:  Chest x-ray and chest CT 04/19/2021 FINDINGS: The cardiac silhouette, mediastinal and hilar contours are within normal limits and stable. Mild tortuosity and calcification of the thoracic aorta again noted. Stable right-sided rib fractures. Stable small right lateral loculated pneumothorax. The lungs are clear. No pleural effusions. IMPRESSION: Stable right rib fractures. Stable very small loculated appearing lateral right pneumothorax. Electronically Signed   By: Rudie Meyer M.D.   On: 04/20/2021 07:18    SIGNED: Kendell Bane, MD, FHM. [facility],  Pager (please use [email or web address] to page/text) Please use Epic Secure Chat for non-urgent communication (7AM-7PM)  If 7PM-7AM, please contact night-coverage [email or web address], 04/20/2021, 12:14 PM

## 2021-04-20 NOTE — Evaluation (Signed)
Physical Therapy Evaluation Patient Details Name: Robyn Crawford MRN: [id number] DOB: 07-23-1946 Today's Date: 04/20/2021   History of Present Illness  Robyn Crawford is a 75 y.o. female with medical history significant for  DM, HTN, who presented to the ED with complaints of a fall that happened on Saturday night, 2 nights ago.  Patient reports she just rolled off her bed onto the floor, hitting the right side of her chest on the floor.  She does not think she hit her head, she did not lose consciousness, has no headache, no change in vision, no difficulty with walking.  Patient reports she was on the floor for about 30 to 40 minutes before she was gradually able to pull herself off the floor using the bed for support.  Since fall she has had persistent right upper chest pain, which restricts her breathing a little bit.  Patient lives alone.  Denies frequent falls.  Patient reports when she was on the floor she felt hot, and felt that her blood sugar was low, she had taken her insulin previously and had not eaten well that evening.   Clinical Impression  Patient functioning near baseline for functional mobility and gait, other than c/o soreness right side of chest wall when sitting up at bedside.  Patient demonstrates good return for ambulation in room, hallways and on stairs without loss of balance.  Plan:  Patient discharged from physical therapy to care of nursing for ambulation daily as tolerated for length of stay.      Follow Up Recommendations No PT follow up    Equipment Recommendations  None recommended by PT    Recommendations for Other Services       Precautions / Restrictions Precautions Precautions: None Restrictions Weight Bearing Restrictions: No      Mobility  Bed Mobility Overal bed mobility: Needs Assistance Bed Mobility: Supine to Sit     Supine to sit: Supervision;Modified independent (Device/Increase time)     General bed mobility comments: increased time,  labored movement, required use of bed rail    Transfers Overall transfer level: Modified independent                  Ambulation/Gait Ambulation/Gait assistance: Modified independent (Device/Increase time) Gait Distance (Feet): 200 Feet Assistive device: None Gait Pattern/deviations: WFL(Within Functional Limits) Gait velocity: slightly decreased   General Gait Details: grossly WFL, demonstrates good return for ambulation in room and hallways without loss of balance  Stairs Stairs: Yes Stairs assistance: Modified independent (Device/Increase time) Stair Management: Alternating pattern;Step to pattern;One rail Right;One rail Left Number of Stairs: 4 General stair comments: demonstrates good return for going up/down steps with using 1 siderail without loss of balance, uses step to pattern when going down steps  Wheelchair Mobility    Modified Rankin (Stroke Patients Only)       Balance Overall balance assessment: No apparent balance deficits (not formally assessed)                                           Pertinent Vitals/Pain Pain Assessment: Faces Faces Pain Scale: Hurts a little bit Pain Location: right side of chest wall soreness Pain Descriptors / Indicators: Sore Pain Intervention(s): Limited activity within patient's tolerance;Monitored during session    Home Living Family/patient expects to be discharged to:: Private residence Living Arrangements: Alone Available Help at Discharge: Family;Available 24 hours/day  Type of Home: House Home Access: Stairs to enter Entrance Stairs-Rails: Right;Left;Can reach both Entrance Stairs-Number of Steps: 2-3 Home Layout: One level Home Equipment: Walker - 2 wheels;Cane - single point;Grab bars - tub/shower      Prior Function Level of Independence: Independent         Comments: community ambulator, drives, works as a Research scientist (physical sciences)  Extremity Assessment Upper Extremity Assessment: Overall WFL for tasks assessed    Lower Extremity Assessment Lower Extremity Assessment: Overall WFL for tasks assessed    Cervical / Trunk Assessment Cervical / Trunk Assessment: Normal  Communication   Communication: No difficulties  Cognition Arousal/Alertness: Awake/alert Behavior During Therapy: WFL for tasks assessed/performed Overall Cognitive Status: Within Functional Limits for tasks assessed                                        General Comments      Exercises     Assessment/Plan    PT Assessment Patent does not need any further PT services  PT Problem List         PT Treatment Interventions      PT Goals (Current goals can be found in the Care Plan section)  Acute Rehab PT Goals Patient Stated Goal: return home with family to assist PT Goal Formulation: With patient Time For Goal Achievement: 04/20/21 Potential to Achieve Goals: Good    Frequency     Barriers to discharge        Co-evaluation               AM-PAC PT "6 Clicks" Mobility  Outcome Measure Help needed turning from your back to your side while in a flat bed without using bedrails?: None Help needed moving from lying on your back to sitting on the side of a flat bed without using bedrails?: None   Help needed standing up from a chair using your arms (e.g., wheelchair or bedside chair)?: None Help needed to walk in hospital room?: None Help needed climbing 3-5 steps with a railing? : None 6 Click Score: 20    End of Session   Activity Tolerance: Patient tolerated treatment well Patient left: in chair;with call bell/phone within reach Nurse Communication: Mobility status PT Visit Diagnosis: Unsteadiness on feet (R26.81);Other abnormalities of gait and mobility (R26.89);Muscle weakness (generalized) (M62.81)    Time: 4097-3532 PT Time Calculation (min) (ACUTE ONLY): 19 min   Charges:   PT Evaluation $PT  Eval Low Complexity: 1 Low PT Treatments $Therapeutic Activity: 8-22 mins        11:46 AM, 04/20/21 Ocie Bob, MPT Physical Therapist with [facility] [phone] office [phone] mobile phone

## 2021-04-20 NOTE — Care Management Obs Status (Signed)
MEDICARE OBSERVATION STATUS NOTIFICATION   Patient Details  Name: Robyn Crawford MRN: [id number] Date of Birth: 1946-01-30   Medicare Observation Status Notification Given:  Yes    Corey Harold 04/20/2021, 4:22 PM

## 2021-04-20 NOTE — Plan of Care (Signed)
  Problem: Education: Goal: Knowledge of General Education information will improve Description Including pain rating scale, medication(s)/side effects and non-pharmacologic comfort measures Outcome: Progressing   Problem: Health Behavior/Discharge Planning: Goal: Ability to manage health-related needs will improve Outcome: Progressing   

## 2021-04-21 ENCOUNTER — Observation Stay (HOSPITAL_COMMUNITY): Payer: Medicare HMO

## 2021-04-21 DIAGNOSIS — E1159 Type 2 diabetes mellitus with other circulatory complications: Secondary | ICD-10-CM | POA: Diagnosis not present

## 2021-04-21 DIAGNOSIS — W19XXXA Unspecified fall, initial encounter: Secondary | ICD-10-CM | POA: Diagnosis not present

## 2021-04-21 DIAGNOSIS — S270XXA Traumatic pneumothorax, initial encounter: Secondary | ICD-10-CM | POA: Diagnosis not present

## 2021-04-21 DIAGNOSIS — I10 Essential (primary) hypertension: Secondary | ICD-10-CM | POA: Diagnosis not present

## 2021-04-21 LAB — GLUCOSE, CAPILLARY: Glucose-Capillary: 162 mg/dL — ABNORMAL HIGH (ref 70–99)

## 2021-04-21 MED ORDER — ACETAMINOPHEN 325 MG PO TABS: 650.0000 mg | ORAL_TABLET | Freq: Four times a day (QID) | ORAL | 0 refills | Status: AC | PRN

## 2021-04-21 NOTE — Progress Notes (Signed)
Nsg Discharge Note  Admit Date:  04/19/2021 Discharge date: 04/21/2021   Robyn Crawford to be D/C'd Home per MD order.  AVS completed.  Copy for chart, and copy for patient signed, and dated. Patient/caregiver able to verbalize understanding.  Discharge Medication: Allergies as of 04/21/2021       Reactions   Codeine         Medication List     STOP taking these medications    metFORMIN 500 MG tablet Commonly known as: GLUCOPHAGE       TAKE these medications    acetaminophen 325 MG tablet Commonly known as: TYLENOL Take 2 tablets (650 mg total) by mouth every 6 (six) hours as needed for mild pain (or Fever >/= 101).   amLODipine 10 MG tablet Commonly known as: NORVASC TAKE 1 TABLET BY MOUTH  DAILY   atorvastatin 20 MG tablet Commonly known as: LIPITOR TAKE 1 TABLET BY MOUTH  DAILY What changed: Another medication with the same name was removed. Continue taking this medication, and follow the directions you see here.   azelastine 0.1 % nasal spray Commonly known as: ASTELIN Place into both nostrils.   canagliflozin 100 MG Tabs tablet Commonly known as: Invokana Take 1 tablet (100 mg total) by mouth daily before breakfast.   clopidogrel 75 MG tablet Commonly known as: PLAVIX Take 75 mg by mouth daily.   fluticasone 50 MCG/ACT nasal spray Commonly known as: FLONASE Place into both nostrils.   hydrochlorothiazide 12.5 MG tablet Commonly known as: HYDRODIURIL TAKE 1 TABLET BY MOUTH  DAILY   insulin glargine 100 UNIT/ML Solostar Pen Commonly known as: Lantus SoloStar Inject 40 Units into the skin at bedtime. What changed: how much to take   lisinopril 40 MG tablet Commonly known as: ZESTRIL TAKE 1 TABLET BY MOUTH  DAILY   pioglitazone 15 MG tablet Commonly known as: ACTOS Take 15 mg by mouth daily.        Discharge Assessment: Vitals:   04/21/21 0811 04/21/21 1218  BP: (!) 153/75 130/63  Pulse:  70  Resp:  18  Temp:  98.5 F (36.9 C)   SpO2:  97%   Skin clean, dry and intact without evidence of skin break down, no evidence of skin tears noted. IV catheter discontinued intact. Site without signs and symptoms of complications - no redness or edema noted at insertion site, patient denies c/o pain - only slight tenderness at site.  Dressing with slight pressure applied.  D/c Instructions-Education: Discharge instructions given to patient/family with verbalized understanding. D/c education completed with patient/family including follow up instructions, medication list, d/c activities limitations if indicated, with other d/c instructions as indicated by MD - patient able to verbalize understanding, all questions fully answered. Patient instructed to return to ED, call 911, or call MD for any changes in condition.  Patient escorted via WC, and D/C home via private auto.  Demetrio Lapping, LPN 8/92/1194 1:74 PM

## 2021-04-21 NOTE — Discharge Summary (Signed)
Physician Discharge Summary [facility]    Patient: Robyn Crawford                   Admit date: 04/19/2021   DOB: November 17, 1945             Discharge date:04/21/2021/7:21 AM VHQ:[id number]                          PCP: [name], MD  Disposition: HOME  Recommendations for Outpatient Follow-up:   Follow up: PCP within 1 week Continue checking her blood sugar QA CHS, monitor your blood sugar closely Continue using the incentive inspirometer Patient is to avoid any means of falling exacerbated rib fractures  Discharge Condition: Stable   Code Status:   Code Status: Full Code  Diet recommendation: Diabetic diet   Discharge Diagnoses:    Principal Problem:   Fall Active Problems:   DM type 2 causing vascular disease (HCC)   Essential hypertension, benign   Pneumothorax   History of Present Illness/ Hospital Course Charline Bills Summary:   Robyn Crawford is a 75 y.o. female with medical history significant for  DM, HTN, who presented to the ED with complaints of a fall that happened on Saturday night, 2 nights ago. Patient reports she just rolled off her bed onto the floor, hitting the right side of her chest on the floor.  She does not think she hit her head, she did not lose consciousness, has no headache, no change in vision, no difficulty with walking.  Patient reports she was on the floor for about 30 to 40 minutes before she was gradually able to pull herself off the floor using the bed for support. Since fall she has had persistent right upper chest pain, which restricts her breathing a little bit. Patient lives alone.  Denies frequent falls. Patient reports when she was on the floor she felt hot, and felt that her blood sugar was low, she had taken her insulin previously and had not eaten well that evening.     ED Course:  Stable vitals.  BMP CBC reviewed, within normal limits  chest x-ray showed nondisplaced fractures of the right fourth fifth sixth ribs with  suspected tiny right lateral pneumothorax.   Chest CT-acute fractures involving the right fourth and fifth ribs, tiny right-sided pneumothorax less than 5%. EDP talked to general surgeon Dr. Henreitta Leber, recommended admission overnight as patient lives alone.      Fall with right fourth, fifth rib fractures and tiny pneumothorax -Remained stable on room air -fall Precaution, -Satting 94% on room air -PT OT evaluation -fall 2 days ago, no focal neurologic deficits hence deferred head CT today.  No frequent falls. - EDP talked to general surgery Dr. Henreitta Leber recommended admission for observation -Repeat chest x-ray in the morning -Oxycodone acetaminophen as needed -Encouraging incentive spirometer, -Monitoring O2 sat... With any changes anticipating repeating CT of chest to assess for pneumothorax -Chest x-ray reviewed   IMPRESSION: Stable right rib fractures. Stable very small loculated appearing lateral right pneumothorax.  -Repeat chest x-ray 04/21/2021 reported of no significant changes to rib fracture or atelectasis   Diabetes mellitus-not well controlled  -CBG 273, 225, 198 - HgbA1c 12.2 -Patient is to resume home insulin regimen, recommended to check her blood glucose level q. [facility], current medication needs to be adjusted as her diabetes seems to be not under control   Hypertension- - stable. -Continue lisinopril and HCTZ     ------------------------------------------------------------------------------------------------------------------------------------------------  DVT prophylaxis:  SCD/Compression stockings Code Status:   Code Status: Full Code    Dispo: The patient is from: Home              Anticipated d/c is to: Home      Discharge Instructions:   Discharge Instructions     Activity as tolerated - No restrictions   Complete by: As directed    Call MD for:  difficulty breathing, headache or visual disturbances   Complete by: As directed    Call MD for:   persistant dizziness or light-headedness   Complete by: As directed    Call MD for:  redness, tenderness, or signs of infection (pain, swelling, redness, odor or green/yellow discharge around incision site)   Complete by: As directed    Call MD for:  temperature >100.4   Complete by: As directed    Diet - low sodium heart healthy   Complete by: As directed    Discharge instructions   Complete by: As directed    Continue to use your incentive spirometer as tolerated while awake   Increase activity slowly   Complete by: As directed         Medication List     STOP taking these medications    metFORMIN 500 MG tablet Commonly known as: GLUCOPHAGE       TAKE these medications    acetaminophen 325 MG tablet Commonly known as: TYLENOL Take 2 tablets (650 mg total) by mouth every 6 (six) hours as needed for mild pain (or Fever >/= 101).   amLODipine 10 MG tablet Commonly known as: NORVASC TAKE 1 TABLET BY MOUTH  DAILY   atorvastatin 20 MG tablet Commonly known as: LIPITOR TAKE 1 TABLET BY MOUTH  DAILY What changed: Another medication with the same name was removed. Continue taking this medication, and follow the directions you see here.   azelastine 0.1 % nasal spray Commonly known as: ASTELIN Place into both nostrils.   canagliflozin 100 MG Tabs tablet Commonly known as: Invokana Take 1 tablet (100 mg total) by mouth daily before breakfast.   clopidogrel 75 MG tablet Commonly known as: PLAVIX Take 75 mg by mouth daily.   fluticasone 50 MCG/ACT nasal spray Commonly known as: FLONASE Place into both nostrils.   hydrochlorothiazide 12.5 MG tablet Commonly known as: HYDRODIURIL TAKE 1 TABLET BY MOUTH  DAILY   insulin glargine 100 UNIT/ML Solostar Pen Commonly known as: Lantus SoloStar Inject 40 Units into the skin at bedtime. What changed: how much to take   lisinopril 40 MG tablet Commonly known as: ZESTRIL TAKE 1 TABLET BY MOUTH  DAILY   pioglitazone 15  MG tablet Commonly known as: ACTOS Take 15 mg by mouth daily.        Allergies  Allergen Reactions   Codeine      Procedures /Studies:   DG Ribs Unilateral W/Chest Right  Result Date: 04/19/2021 CLINICAL DATA:  Posterior right-sided rib pain after fall EXAM: RIGHT RIBS AND CHEST - 3+ VIEW COMPARISON:  11/13/2005 FINDINGS: Nondisplaced fractures of the posterolateral aspects of the right fourth, fifth, and sixth ribs. Suspect tiny right lateral pneumothorax adjacent to the rib fracture sites. No pleural effusion. Both lungs are clear. Heart size and mediastinal contours are within normal limits. Atherosclerotic calcification of the aortic knob. IMPRESSION: Nondisplaced fractures of the posterolateral aspects of the right fourth, fifth, and sixth ribs. Suspect tiny right lateral pneumothorax. Electronically Signed   By: Duanne Guess D.O.  On: 04/19/2021 14:18   CT Chest Wo Contrast  Result Date: 04/19/2021 CLINICAL DATA:  Rib fracture suspected, traumatic Chest trauma, mod-severe EXAM: CT CHEST WITHOUT CONTRAST TECHNIQUE: Multidetector CT imaging of the chest was performed following the standard protocol without IV contrast. COMPARISON:  Same-day x-ray FINDINGS: Cardiovascular: Heart size within normal limits. No pericardial effusion. Atherosclerotic calcifications of the aorta and coronary arteries. No thoracic aortic aneurysm. Mediastinum/Nodes: No axillary or mediastinal lymphadenopathy identified. Evaluation of the hilar structures is limited in the absence of intravenous contrast. Within this limitation, no obvious hilar adenopathy or mass is identified. The left thyroid lobe is enlarged and heterogeneous. Trachea and esophagus within normal limits. Lungs/Pleura: Tiny right-sided pneumothorax (less than 5% volume). No evidence of pulmonary contusion or laceration. Lungs are otherwise clear. No pleural effusion. Upper Abdomen: No acute abnormality. Musculoskeletal: Acute fractures  involving the lateral aspects of the right fourth and fifth ribs without significant displacement. Remaining osseous structures appear intact. Thoracic vertebral body heights and alignment are maintained. No chest wall abnormality identified. IMPRESSION: 1. Acute fractures involving the right fourth and fifth ribs. 2. Tiny right-sided pneumothorax (less than 5% volume). 3. Enlarged and heterogeneous left thyroid lobe. Recommend thyroid ultrasound (ref: J Am Coll Radiol. 2015 Feb;12(2): 143-50). 4. Aortic and coronary artery atherosclerosis (ICD10-I70.0). Electronically Signed   By: Duanne GuessNicholas  Plundo D.O.   On: 04/19/2021 15:52   DG CHEST PORT 1 VIEW  Result Date: 04/20/2021 CLINICAL DATA:  Shortness of breath. EXAM: PORTABLE CHEST 1 VIEW COMPARISON:  Chest x-ray and chest CT 04/19/2021 FINDINGS: The cardiac silhouette, mediastinal and hilar contours are within normal limits and stable. Mild tortuosity and calcification of the thoracic aorta again noted. Stable right-sided rib fractures. Stable small right lateral loculated pneumothorax. The lungs are clear. No pleural effusions. IMPRESSION: Stable right rib fractures. Stable very small loculated appearing lateral right pneumothorax. Electronically Signed   By: Rudie MeyerP.  Gallerani M.D.   On: 04/20/2021 07:18    Subjective:   Patient was seen and examined 04/21/2021, 7:21 AM Patient stable today. No acute distress.  No issues overnight Stable for discharge.  Discharge Exam:    Vitals:   04/20/21 0958 04/20/21 1339 04/20/21 2058 04/21/21 0506  BP: (!) 119/55 109/72 (!) 131/54 136/68  Pulse:  64 63 65  Resp:  18 20 19   Temp:  98.5 F (36.9 C) 98.1 F (36.7 C) 98.3 F (36.8 C)  TempSrc:  Oral Oral   SpO2:  98% 96% 97%  Weight:      Height:        General: Pt lying comfortably in bed & appears in no obvious distress. Cardiovascular: S1 & S2 heard, RRR, S1/S2 +. No murmurs, rubs, gallops or clicks. No JVD or pedal edema. Respiratory: Clear to  auscultation without wheezing, rhonchi or crackles. No increased work of breathing. Abdominal:  Non-distended, non-tender & soft. No organomegaly or masses appreciated. Normal bowel sounds heard. CNS: Alert and oriented. No focal deficits. Extremities: no edema, no cyanosis      The results of significant diagnostics from this hospitalization (including imaging, microbiology, ancillary and laboratory) are listed below for reference.      Microbiology:   Recent Results (from the past 240 hour(s))  Resp Panel by RT-PCR (Flu A&B, Covid) Nasopharyngeal Swab     Status: None   Collection Time: 04/19/21  4:48 PM   Specimen: Nasopharyngeal Swab; Nasopharyngeal(NP) swabs in vial transport medium  Result Value Ref Range Status   SARS Coronavirus 2 by RT  PCR NEGATIVE NEGATIVE Final    Comment: (NOTE) SARS-CoV-2 target nucleic acids are NOT DETECTED.  The SARS-CoV-2 RNA is generally detectable in upper respiratory specimens during the acute phase of infection. The lowest concentration of SARS-CoV-2 viral copies this assay can detect is 138 copies/mL. A negative result does not preclude SARS-Cov-2 infection and should not be used as the sole basis for treatment or other patient management decisions. A negative result may occur with  improper specimen collection/handling, submission of specimen other than nasopharyngeal swab, presence of viral mutation(s) within the areas targeted by this assay, and inadequate number of viral copies(<138 copies/mL). A negative result must be combined with clinical observations, patient history, and epidemiological information. The expected result is Negative.  Fact Sheet for Patients:  BloggerCourse.com  Fact Sheet for Healthcare Providers:  SeriousBroker.it  This test is no t yet approved or cleared by the [place] FDA and  has been authorized for detection and/or diagnosis of SARS-CoV-2 by FDA  under an Emergency Use Authorization (EUA). This EUA will remain  in effect (meaning this test can be used) for the duration of the COVID-19 declaration under Section 564(b)(1) of the Act, 21 U.S.C.section 360bbb-3(b)(1), unless the authorization is terminated  or revoked sooner.       Influenza A by PCR NEGATIVE NEGATIVE Final   Influenza B by PCR NEGATIVE NEGATIVE Final    Comment: (NOTE) The Xpert Xpress SARS-CoV-2/FLU/RSV plus assay is intended as an aid in the diagnosis of influenza from Nasopharyngeal swab specimens and should not be used as a sole basis for treatment. Nasal washings and aspirates are unacceptable for Xpert Xpress SARS-CoV-2/FLU/RSV testing.  Fact Sheet for Patients: BloggerCourse.com  Fact Sheet for Healthcare Providers: SeriousBroker.it  This test is not yet approved or cleared by the [place] FDA and has been authorized for detection and/or diagnosis of SARS-CoV-2 by FDA under an Emergency Use Authorization (EUA). This EUA will remain in effect (meaning this test can be used) for the duration of the COVID-19 declaration under Section 564(b)(1) of the Act, 21 U.S.C. section 360bbb-3(b)(1), unless the authorization is terminated or revoked.  Performed at [facility], [address] [place], [place] [address]      Labs:   CBC: Recent Labs  Lab 04/19/21 1807  WBC 8.0  NEUTROABS 3.6  HGB 13.1  HCT 41.7  MCV 91.4  PLT 260   Basic Metabolic Panel: Recent Labs  Lab 04/19/21 1807  NA 139  K 3.6  CL 102  CO2 30  GLUCOSE 98  BUN 29*  CREATININE 1.23*  CALCIUM 9.4   Liver Function Tests: No results for input(s): AST, ALT, ALKPHOS, BILITOT, PROT, ALBUMIN in the last 168 hours. BNP (last 3 results) No results for input(s): BNP in the last 8760 hours. Cardiac Enzymes: No results for input(s): CKTOTAL, CKMB, CKMBINDEX, TROPONINI in the last 168 hours. CBG: Recent Labs  Lab  04/19/21 2212 04/20/21 0809 04/20/21 1141 04/20/21 1644 04/20/21 2101  GLUCAP 116* 77 273* 225* 198*   Hgb A1c Recent Labs    04/19/21 1807  HGBA1C 12.2*   Lipid Profile No results for input(s): CHOL, HDL, LDLCALC, TRIG, CHOLHDL, LDLDIRECT in the last 72 hours. Thyroid function studies No results for input(s): TSH, T4TOTAL, T3FREE, THYROIDAB in the last 72 hours.  Invalid input(s): FREET3 Anemia work up No results for input(s): VITAMINB12, FOLATE, FERRITIN, TIBC, IRON, RETICCTPCT in the last 72 hours. Urinalysis No results found for: COLORURINE, APPEARANCEUR, LABSPEC, PHURINE, GLUCOSEU, HGBUR, BILIRUBINUR, KETONESUR, PROTEINUR,  UROBILINOGEN, NITRITE, LEUKOCYTESUR       Time coordinating discharge: Over 45 minutes  SIGNED: Kendell Bane, MD, FACP, [facility]. [facility],  Please use [email or web address] to Page If 7PM-7AM, please contact night-coverage [email or web address] 04/21/2021, 7:21 AM

## 2021-05-11 NOTE — Progress Notes (Signed)
CARDIOLOGY CONSULT NOTE       Patient ID: Robyn Crawford MRN: [id number] DOB/AGE: 01-Feb-1946 75 y.o.  Admit date: (Not on file) Referring Physician: Bryna Colander Primary Physician: [name], MD Primary Cardiologist: New Reason for Consultation: Chest pain   Active Problems:   * No active hospital problems. *   HPI:  75 y.o. referred by Dr Bryna Colander for chest pain History of DM and HTN. Recently d/c from hospital 04/21/21 after falling off bed and sustaining fractures to right 4/5 th ribs. No associated pneumothorax Rx with oxycodone IS A1c was 12.2 and BS poorly controlled No previous history of vascular disease, syncope, palpitations or chest pain other than recent trauma  PAD screening recently no details other than ABI left 0.56 and right 0.86.  Vague mention in PMH on primaries note regarding history of CAD with stents managed by a Dr Graciela Husbands   She had a card in her wallet indicated stents to mid/distal RCA [facility] 12/24/08 No angina She denies claudication despite moderate reduction in left ABI. 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  ROS All other systems reviewed and negative except as noted above  Past Medical History:  Diagnosis Date   Diabetes mellitus, type II (HCC)    Hypertension     Family History  Problem Relation Age of Onset   Diabetes Father    Diabetes Sister    Diabetes Brother     Social History   Socioeconomic History   Marital status: Widowed    Spouse name: Not on file   Number of children: Not on file   Years of education: Not on file   Highest education level: Not on file  Occupational History   Not on file  Tobacco Use   Smoking status: Former   Smokeless tobacco: Never  Vaping Use   Vaping Use: Never used  Substance and Sexual Activity   Alcohol use: No    Alcohol/week: 0.0 standard drinks   Drug use: No   Sexual activity: Not  Currently    Birth control/protection: Abstinence  Other Topics Concern   Not on file  Social History Narrative   Not on file   Social Determinants of Health   Financial Resource Strain: Not on file  Food Insecurity: Not on file  Transportation Needs: Not on file  Physical Activity: Not on file  Stress: Not on file  Social Connections: Not on file  Intimate Partner Violence: Not on file    Past Surgical History:  Procedure Laterality Date   ABDOMINAL HYSTERECTOMY     CORONARY ANGIOPLASTY WITH STENT PLACEMENT        Current Outpatient Medications:    acetaminophen (TYLENOL) 325 MG tablet, Take 2 tablets (650 mg total) by mouth every 6 (six) hours as needed for mild pain (or Fever >/= 101)., Disp: 30 tablet, Rfl: 0   amLODipine (NORVASC) 10 MG tablet, TAKE 1 TABLET BY MOUTH  DAILY, Disp: 90 tablet, Rfl: 0   aspirin EC 81 MG tablet, Take 81 mg by mouth daily. Swallow whole., Disp: , Rfl:    atorvastatin (LIPITOR) 20 MG tablet, TAKE 1 TABLET BY MOUTH  DAILY, Disp: 90 tablet, Rfl: 0   azelastine (ASTELIN) 0.1 % nasal spray, Place into both nostrils., Disp: , Rfl:    canagliflozin (INVOKANA) 100 MG TABS tablet, Take 1 tablet (100 mg total) by  mouth daily before breakfast., Disp: 30 tablet, Rfl: 3   clopidogrel (PLAVIX) 75 MG tablet, Take 75 mg by mouth daily., Disp: , Rfl:    fluticasone (FLONASE) 50 MCG/ACT nasal spray, Place into both nostrils., Disp: , Rfl:    hydrochlorothiazide (HYDRODIURIL) 12.5 MG tablet, TAKE 1 TABLET BY MOUTH  DAILY, Disp: 90 tablet, Rfl: 0   Insulin Glargine (LANTUS SOLOSTAR) 100 UNIT/ML Solostar Pen, Inject 40 Units into the skin at bedtime. (Patient taking differently: Inject 50 Units into the skin at bedtime.), Disp: 15 mL, Rfl: 2   lisinopril (PRINIVIL,ZESTRIL) 40 MG tablet, TAKE 1 TABLET BY MOUTH  DAILY, Disp: 90 tablet, Rfl: 0   nitroGLYCERIN (NITROSTAT) 0.4 MG SL tablet, Place under the tongue., Disp: , Rfl:    pioglitazone (ACTOS) 15 MG tablet, Take  15 mg by mouth daily., Disp: , Rfl:     Physical Exam: Blood pressure (!) 152/94, pulse 76, height 5\' 3"  (1.6 m), weight 92.1 kg, SpO2 95 %.    Affect appropriate Obese black female  HEENT: normal Neck supple with no adenopathy JVP normal left bruits no thyromegaly Lungs clear with no wheezing and good diaphragmatic motion Heart:  S1/S2 no murmur, no rub, gallop or click PMI normal Abdomen: benighn, BS positve, no tenderness, no AAA no bruit.  No HSM or HJR Decreased PT/DP on left  No edema Neuro non-focal Pain palpation over right 4/5 th ribs    Labs:   Lab Results  Component Value Date   WBC 8.0 04/19/2021   HGB 13.1 04/19/2021   HCT 41.7 04/19/2021   MCV 91.4 04/19/2021   PLT 260 04/19/2021   No results for input(s): NA, K, CL, CO2, BUN, CREATININE, CALCIUM, PROT, BILITOT, ALKPHOS, ALT, AST, GLUCOSE in the last 168 hours.  Invalid input(s): LABALBU No results found for: CKTOTAL, CKMB, CKMBINDEX, TROPONINI  Lab Results  Component Value Date   CHOL 156 01/26/2016   Lab Results  Component Value Date   HDL 46 01/26/2016   Lab Results  Component Value Date   LDLCALC 95 01/26/2016   Lab Results  Component Value Date   TRIG 73 01/26/2016   Lab Results  Component Value Date   CHOLHDL 3.4 01/26/2016   No results found for: LDLDIRECT    Radiology: DG Chest 2 View  Result Date: 04/21/2021 CLINICAL DATA:  Right lateral rib and chest pain after falling out of the bed over the weekend, initial encounter. EXAM: CHEST - 2 VIEW COMPARISON:  Chest radiograph 04/20/2021 and CT chest 04/19/2021. FINDINGS: Trachea is midline. Heart size stable. Thoracic aorta is calcified. Slight lateral pleural thickening associated with acute right rib fractures, better seen on 04/19/2021. Minimal subsegmental atelectasis in the lung bases. Lungs are otherwise clear. There may be trace right pleural fluid. No pneumothorax. IMPRESSION: 1. Possible trace right pleural fluid.  No  pneumothorax. 2. Nondisplaced right rib fractures, better seen on 04/19/2021. 3.  Aortic atherosclerosis (ICD10-I70.0). Electronically Signed   By: 04/21/2021 M.D.   On: 04/21/2021 10:34   DG Ribs Unilateral W/Chest Right  Result Date: 04/19/2021 CLINICAL DATA:  Posterior right-sided rib pain after fall EXAM: RIGHT RIBS AND CHEST - 3+ VIEW COMPARISON:  11/13/2005 FINDINGS: Nondisplaced fractures of the posterolateral aspects of the right fourth, fifth, and sixth ribs. Suspect tiny right lateral pneumothorax adjacent to the rib fracture sites. No pleural effusion. Both lungs are clear. Heart size and mediastinal contours are within normal limits. Atherosclerotic calcification of the aortic knob. IMPRESSION: Nondisplaced fractures  of the posterolateral aspects of the right fourth, fifth, and sixth ribs. Suspect tiny right lateral pneumothorax. Electronically Signed   By: Duanne Guess D.O.   On: 04/19/2021 14:18   CT Chest Wo Contrast  Result Date: 04/19/2021 CLINICAL DATA:  Rib fracture suspected, traumatic Chest trauma, mod-severe EXAM: CT CHEST WITHOUT CONTRAST TECHNIQUE: Multidetector CT imaging of the chest was performed following the standard protocol without IV contrast. COMPARISON:  Same-day x-ray FINDINGS: Cardiovascular: Heart size within normal limits. No pericardial effusion. Atherosclerotic calcifications of the aorta and coronary arteries. No thoracic aortic aneurysm. Mediastinum/Nodes: No axillary or mediastinal lymphadenopathy identified. Evaluation of the hilar structures is limited in the absence of intravenous contrast. Within this limitation, no obvious hilar adenopathy or mass is identified. The left thyroid lobe is enlarged and heterogeneous. Trachea and esophagus within normal limits. Lungs/Pleura: Tiny right-sided pneumothorax (less than 5% volume). No evidence of pulmonary contusion or laceration. Lungs are otherwise clear. No pleural effusion. Upper Abdomen: No acute  abnormality. Musculoskeletal: Acute fractures involving the lateral aspects of the right fourth and fifth ribs without significant displacement. Remaining osseous structures appear intact. Thoracic vertebral body heights and alignment are maintained. No chest wall abnormality identified. IMPRESSION: 1. Acute fractures involving the right fourth and fifth ribs. 2. Tiny right-sided pneumothorax (less than 5% volume). 3. Enlarged and heterogeneous left thyroid lobe. Recommend thyroid ultrasound (ref: J Am Coll Radiol. 2015 Feb;12(2): 143-50). 4. Aortic and coronary artery atherosclerosis (ICD10-I70.0). Electronically Signed   By: Duanne Guess D.O.   On: 04/19/2021 15:52   DG CHEST PORT 1 VIEW  Result Date: 04/20/2021 CLINICAL DATA:  Shortness of breath. EXAM: PORTABLE CHEST 1 VIEW COMPARISON:  Chest x-ray and chest CT 04/19/2021 FINDINGS: The cardiac silhouette, mediastinal and hilar contours are within normal limits and stable. Mild tortuosity and calcification of the thoracic aorta again noted. Stable right-sided rib fractures. Stable small right lateral loculated pneumothorax. The lungs are clear. No pleural effusions. IMPRESSION: Stable right rib fractures. Stable very small loculated appearing lateral right pneumothorax. Electronically Signed   By: Rudie Meyer M.D.   On: 04/20/2021 07:18    EKG: 05/17/2021 NSR rate 73 [id number]   ASSESSMENT AND PLAN:   Chest Pain:  from recent rib fractures not cardiac. Stable history of stents to RCA 2010 continue medical Rx HTN:  Well controlled.  Continue current medications and low sodium Dash type diet.   DM:  Discussed low carb diet.  Target hemoglobin A1c is 6.5 or less.  Continue current medications. HLD:  continue statin labs with primary  PVD:  refer to Dr Arbie Cookey in [place] moderate left reduction but no claudication Left bruit given vascular disease in heart and legs need to r/o carotid dx   Carotid duplex left bruit Refer [facility]  [place] F/U US in 6 months   Signed: Charlton Haws 05/17/2021, 9:54 AM

## 2021-05-17 ENCOUNTER — Other Ambulatory Visit: Payer: Self-pay

## 2021-05-17 ENCOUNTER — Encounter: Payer: Self-pay | Admitting: Cardiovascular Disease

## 2021-05-17 ENCOUNTER — Ambulatory Visit: Payer: Medicare HMO | Admitting: Cardiovascular Disease

## 2021-05-17 VITALS — BP 152/94 | HR 76 | Ht 63.0 in | Wt 203.0 lb

## 2021-05-17 DIAGNOSIS — I10 Essential (primary) hypertension: Secondary | ICD-10-CM | POA: Diagnosis not present

## 2021-05-17 DIAGNOSIS — I739 Peripheral vascular disease, unspecified: Secondary | ICD-10-CM | POA: Diagnosis not present

## 2021-05-17 DIAGNOSIS — I2510 Atherosclerotic heart disease of native coronary artery without angina pectoris: Secondary | ICD-10-CM | POA: Diagnosis not present

## 2021-05-17 DIAGNOSIS — R0989 Other specified symptoms and signs involving the circulatory and respiratory systems: Secondary | ICD-10-CM

## 2021-05-17 NOTE — Patient Instructions (Signed)
Medication Instructions:  Your physician recommends that you continue on your current medications as directed. Please refer to the Current Medication list given to you today.  *If you need a refill on your cardiac medications before your next appointment, please call your pharmacy*   Lab Work: NONE   If you have labs (blood work) drawn today and your tests are completely normal, you will receive your results only by: MyChart Message (if you have MyChart) OR A paper copy in the mail If you have any lab test that is abnormal or we need to change your treatment, we will call you to review the results.   Testing/Procedures: Your physician has requested that you have a carotid duplex. This test is an ultrasound of the carotid arteries in your neck. It looks at blood flow through these arteries that supply the brain with blood. Allow one hour for this exam. There are no restrictions or special instructions.    Follow-Up: At [facility], you and your health needs are our priority.  As part of our continuing mission to provide you with exceptional heart care, we have created designated Provider Care Teams.  These Care Teams include your primary Cardiologist (physician) and Advanced Practice Providers (APPs -  Physician Assistants and Nurse Practitioners) who all work together to provide you with the care you need, when you need it.  We recommend signing up for the patient portal called "MyChart".  Sign up information is provided on this After Visit Summary.  MyChart is used to connect with patients for Virtual Visits (Telemedicine).  Patients are able to view lab/test results, encounter notes, upcoming appointments, etc.  Non-urgent messages can be sent to your provider as well.   To learn more about what you can do with MyChart, go to [email or web address].    Your next appointment:   6 month(s)  The format for your next appointment:   In Person  Provider:   Peter Nishan, MD   Other  Instructions Thank you for choosing [facility]!    

## 2021-05-25 ENCOUNTER — Ambulatory Visit (HOSPITAL_COMMUNITY)
Admission: RE | Admit: 2021-05-25 | Discharge: 2021-05-25 | Disposition: A | Discharge status: Home / Self Care | Payer: Medicare HMO | Source: Ambulatory Visit | Attending: Cardiovascular Disease | Admitting: Cardiovascular Disease

## 2021-05-25 ENCOUNTER — Other Ambulatory Visit: Payer: Self-pay

## 2021-05-25 DIAGNOSIS — R0989 Other specified symptoms and signs involving the circulatory and respiratory systems: Secondary | ICD-10-CM | POA: Diagnosis present

## 2021-06-23 ENCOUNTER — Ambulatory Visit: Payer: Medicare HMO | Admitting: Vascular Surgery

## 2021-06-23 ENCOUNTER — Encounter: Payer: Self-pay | Admitting: Vascular Surgery

## 2021-06-23 ENCOUNTER — Other Ambulatory Visit: Payer: Self-pay

## 2021-06-23 VITALS — BP 169/78 | HR 75 | Resp 16 | Ht 64.0 in | Wt 204.0 lb

## 2021-06-23 DIAGNOSIS — M7989 Other specified soft tissue disorders: Secondary | ICD-10-CM

## 2021-06-23 NOTE — Progress Notes (Signed)
V[facility] and Vein Specialist of [place]  Patient name: Robyn Crawford MRN: [id number] DOB: 1945/10/22 Sex: female  REASON FOR CONSULT: Discuss recent noninvasive carotid studies and evaluation of bilateral lower extremity.  HPI: Robyn Crawford is a 75 y.o. female, who is here today for evaluation.  She has a history of prior coronary stents.  She has been followed with a carotid duplex and underwent recent duplex on 05/25/2021 at [facility].  This revealed 1 to 59% stenoses bilaterally.  She has no history of transient ischemic attack or stroke.  He denies any history of lower extremity claudication.  She does report occasional swelling in her left leg and feels this is related to medication.  She has no history of DVT and no history of venous stasis ulceration.  Past Medical History:  Diagnosis Date   Diabetes mellitus, type II (HCC)    Hypertension     Family History  Problem Relation Age of Onset   Diabetes Father    Diabetes Sister    Diabetes Brother     SOCIAL HISTORY: Social History   Socioeconomic History   Marital status: Widowed    Spouse name: Not on file   Number of children: Not on file   Years of education: Not on file   Highest education level: Not on file  Occupational History   Not on file  Tobacco Use   Smoking status: Former   Smokeless tobacco: Never  Vaping Use   Vaping Use: Never used  Substance and Sexual Activity   Alcohol use: No    Alcohol/week: 0.0 standard drinks   Drug use: No   Sexual activity: Not Currently    Birth control/protection: Abstinence  Other Topics Concern   Not on file  Social History Narrative   Not on file   Social Determinants of Health   Financial Resource Strain: Not on file  Food Insecurity: Not on file  Transportation Needs: Not on file  Physical Activity: Not on file  Stress: Not on file  Social Connections: Not on file  Intimate Partner Violence: Not on file     Allergies  Allergen Reactions   Codeine Other (See Comments)    Current Outpatient Medications  Medication Sig Dispense Refill   amLODipine (NORVASC) 10 MG tablet TAKE 1 TABLET BY MOUTH  DAILY 90 tablet 0   aspirin EC 81 MG tablet Take 81 mg by mouth daily. Swallow whole.     atorvastatin (LIPITOR) 20 MG tablet TAKE 1 TABLET BY MOUTH  DAILY 90 tablet 0   azelastine (ASTELIN) 0.1 % nasal spray Place into both nostrils.     canagliflozin (INVOKANA) 100 MG TABS tablet Take 1 tablet (100 mg total) by mouth daily before breakfast. 30 tablet 3   clopidogrel (PLAVIX) 75 MG tablet Take 75 mg by mouth daily.     fluticasone (FLONASE) 50 MCG/ACT nasal spray Place into both nostrils.     hydrochlorothiazide (HYDRODIURIL) 12.5 MG tablet TAKE 1 TABLET BY MOUTH  DAILY 90 tablet 0   Insulin Glargine (LANTUS SOLOSTAR) 100 UNIT/ML Solostar Pen Inject 40 Units into the skin at bedtime. (Patient taking differently: Inject 50 Units into the skin at bedtime.) 15 mL 2   lisinopril (PRINIVIL,ZESTRIL) 40 MG tablet TAKE 1 TABLET BY MOUTH  DAILY 90 tablet 0   nitroGLYCERIN (NITROSTAT) 0.4 MG SL tablet Place under the tongue.     pioglitazone (ACTOS) 15 MG tablet Take 15 mg by mouth daily.  No current facility-administered medications for this visit.    REVIEW OF SYSTEMS:  [X]  denotes positive finding, [ ]  denotes negative finding Cardiac  Comments:  Chest pain or chest pressure:    Shortness of breath upon exertion:    Short of breath when lying flat:    Irregular heart rhythm:        Vascular    Pain in calf, thigh, or hip brought on by ambulation:    Pain in feet at night that wakes you up from your sleep:     Blood clot in your veins:    Leg swelling:  x       Pulmonary    Oxygen at home:    Productive cough:     Wheezing:         Neurologic    Sudden weakness in arms or legs:     Sudden numbness in arms or legs:     Sudden onset of difficulty speaking or slurred speech:    Temporary  loss of vision in one eye:     Problems with dizziness:         Gastrointestinal    Blood in stool:     Vomited blood:         Genitourinary    Burning when urinating:     Blood in urine:        Psychiatric    Major depression:         Hematologic    Bleeding problems:    Problems with blood clotting too easily:        Skin    Rashes or ulcers:        Constitutional    Fever or chills:      PHYSICAL EXAM: Vitals:   06/23/21 0943  BP: (!) 169/78  Pulse: 75  Resp: 16  SpO2: 96%  Weight: 204 lb (92.5 kg)  Height: 5\' 4"  (1.626 m)    GENERAL: The patient is a well-nourished female, in no acute distress. The vital signs are documented above. CARDIOVASCULAR: 2+ radial pulses bilaterally.  1-2+ anterior tibial pulses above her ankles bilaterally.  No specific swelling today bilaterally. PULMONARY: There is good air exchange  MUSCULOSKELETAL: There are no major deformities or cyanosis. NEUROLOGIC: No focal weakness or paresthesias are detected. SKIN: There are no ulcers or rashes noted. PSYCHIATRIC: The patient has a normal affect.  DATA:  Carotid duplex revealed from 05/25/2021 and discussed with the patient.  No significant stenosis and I agree with the recommendation for repeat in 2 years  MEDICAL ISSUES: No evidence of symptoms related to lower extremity arterial insufficiency.  No history of cerebrovascular disease.  Would not recommend any further testing.  We will follow-up as needed   06/25/21, MD [facility] [phone] Pager [phone]  Note: Portions of this report may have been transcribed using voice recognition software.  Every effort has been made to ensure accuracy; however, inadvertent computerized transcription errors may still be present.

## 2022-02-10 LAB — HEMOGLOBIN A1C: Hemoglobin A1C: 12.8

## 2022-02-11 LAB — BASIC METABOLIC PANEL
BUN: 16 (ref 4–21)
CO2: 31 — AB (ref 13–22)
Chloride: 99 (ref 99–108)
Creatinine: 1.2 — AB (ref 0.5–1.1)
Glucose: 185
Potassium: 4.2 mEq/L (ref 3.5–5.1)
Sodium: 141 (ref 137–147)

## 2022-02-11 LAB — HEPATIC FUNCTION PANEL
AST: 21 (ref 13–35)
Alkaline Phosphatase: 117 (ref 25–125)
Bilirubin, Total: 0.7

## 2022-05-19 LAB — COMPREHENSIVE METABOLIC PANEL
Albumin: 4.3 (ref 3.5–5.0)
Calcium: 9.9 (ref 8.7–10.7)
Globulin: 2.5

## 2022-05-19 LAB — LIPID PANEL
Cholesterol: 154 (ref 0–200)
HDL: 46 (ref 35–70)
LDL Cholesterol: 93
Triglycerides: 66 (ref 40–160)

## 2022-06-27 ENCOUNTER — Ambulatory Visit: Payer: Medicare HMO | Admitting: "Endocrinology

## 2022-06-27 ENCOUNTER — Encounter: Payer: Self-pay | Admitting: "Endocrinology

## 2022-06-27 VITALS — BP 144/78 | HR 88 | Ht 65.0 in | Wt 203.0 lb

## 2022-06-27 DIAGNOSIS — Z91199 Patient's noncompliance with other medical treatment and regimen due to unspecified reason: Secondary | ICD-10-CM

## 2022-06-27 DIAGNOSIS — I10 Essential (primary) hypertension: Secondary | ICD-10-CM

## 2022-06-27 DIAGNOSIS — Z6833 Body mass index (BMI) 33.0-33.9, adult: Secondary | ICD-10-CM

## 2022-06-27 DIAGNOSIS — E782 Mixed hyperlipidemia: Secondary | ICD-10-CM

## 2022-06-27 DIAGNOSIS — E1159 Type 2 diabetes mellitus with other circulatory complications: Secondary | ICD-10-CM

## 2022-06-27 DIAGNOSIS — E6609 Other obesity due to excess calories: Secondary | ICD-10-CM

## 2022-06-27 MED ORDER — LANTUS SOLOSTAR 100 UNIT/ML ~~LOC~~ SOPN: 60.0000 [IU] | PEN_INJECTOR | Freq: Every day | SUBCUTANEOUS | 1 refills | Status: DC

## 2022-06-27 NOTE — Progress Notes (Signed)
Endocrinology Consult Note       06/27/2022, 10:01 AM   Subjective:    Patient ID: Robyn Crawford, female    DOB: [birth year].  ALBERTA LENHARD is being seen in consultation for management of currently uncontrolled symptomatic diabetes requested by  [name], MD.   Past Medical History:  Diagnosis Date   Diabetes mellitus, type II (HCC)    Heart attack (HCC)    Hyperlipidemia    Hypertension     Past Surgical History:  Procedure Laterality Date   ABDOMINAL HYSTERECTOMY     CORONARY ANGIOPLASTY WITH STENT PLACEMENT      Social History   Socioeconomic History   Marital status: Widowed    Spouse name: Not on file   Number of children: Not on file   Years of education: Not on file   Highest education level: Not on file  Occupational History   Not on file  Tobacco Use   Smoking status: Former   Smokeless tobacco: Never  Vaping Use   Vaping Use: Never used  Substance and Sexual Activity   Alcohol use: No    Alcohol/week: 0.0 standard drinks of alcohol   Drug use: No   Sexual activity: Not Currently    Birth control/protection: Abstinence  Other Topics Concern   Not on file  Social History Narrative   Not on file   Social Determinants of Health   Financial Resource Strain: Not on file  Food Insecurity: Not on file  Transportation Needs: Not on file  Physical Activity: Not on file  Stress: Not on file  Social Connections: Not on file    Family History  Problem Relation Age of Onset   Hypertension Mother    Thyroid disease Mother    Diabetes Father    Diabetes Sister    Diabetes Brother     Outpatient Encounter Medications as of 06/27/2022  Medication Sig   amLODipine (NORVASC) 10 MG tablet TAKE 1 TABLET BY MOUTH  DAILY   aspirin EC 81 MG tablet Take 81 mg by mouth daily. Swallow whole.   atorvastatin (LIPITOR) 20 MG tablet TAKE 1 TABLET BY MOUTH  DAILY   azelastine  (ASTELIN) 0.1 % nasal spray Place into both nostrils.   clopidogrel (PLAVIX) 75 MG tablet Take 75 mg by mouth daily.   fluticasone (FLONASE) 50 MCG/ACT nasal spray Place into both nostrils.   hydrochlorothiazide (HYDRODIURIL) 12.5 MG tablet TAKE 1 TABLET BY MOUTH  DAILY   insulin glargine (LANTUS SOLOSTAR) 100 UNIT/ML Solostar Pen Inject 60 Units into the skin at bedtime.   lisinopril (PRINIVIL,ZESTRIL) 40 MG tablet TAKE 1 TABLET BY MOUTH  DAILY   nitroGLYCERIN (NITROSTAT) 0.4 MG SL tablet Place under the tongue.   pregabalin (LYRICA) 50 MG capsule Take 50 mg by mouth 2 (two) times daily.   [DISCONTINUED] canagliflozin (INVOKANA) 100 MG TABS tablet Take 1 tablet (100 mg total) by mouth daily before breakfast.   [DISCONTINUED] Insulin Glargine (LANTUS SOLOSTAR) 100 UNIT/ML Solostar Pen Inject 40 Units into the skin at bedtime. (Patient taking differently: Inject 60 Units into the skin at bedtime.)   [DISCONTINUED] pioglitazone (ACTOS)  15 MG tablet Take 15 mg by mouth daily.   No facility-administered encounter medications on file as of 06/27/2022.    ALLERGIES: Allergies  Allergen Reactions   Codeine Other (See Comments)    VACCINATION STATUS:  There is no immunization history on file for this patient.  Diabetes She presents for her initial diabetic visit. She has type 2 diabetes mellitus. Onset time: She was diagnosed at approximate age of 76 years. There are no hypoglycemic associated symptoms. Pertinent negatives for hypoglycemia include no confusion, headaches, pallor or seizures. Associated symptoms include blurred vision, fatigue, polydipsia and polyuria. Pertinent negatives for diabetes include no chest pain and no polyphagia. There are no hypoglycemic complications. Symptoms are worsening. Diabetic complications include heart disease, nephropathy and peripheral neuropathy. Risk factors for coronary artery disease include diabetes mellitus, dyslipidemia, family history, obesity,  hypertension, sedentary lifestyle, post-menopausal and tobacco exposure. Current diabetic treatment includes insulin injections. Her weight is fluctuating minimally. She is following a generally unhealthy diet. When asked about meal planning, she reported none. She never participates in exercise. (She did not bring any logs nor meter with her.  This patient was seen previously with clinic for uncontrolled type 2 diabetes, last visit in 2017.  She returns with an A1c of 11.8%.   ) An ACE inhibitor/angiotensin II receptor blocker is being taken. Eye exam is current.  Hyperlipidemia This is a chronic problem. The current episode started more than 1 year ago. The problem is controlled. Exacerbating diseases include diabetes and obesity. Pertinent negatives include no chest pain, myalgias or shortness of breath. Current antihyperlipidemic treatment includes statins. Risk factors for coronary artery disease include dyslipidemia, diabetes mellitus, hypertension, obesity, post-menopausal and a sedentary lifestyle.  Hypertension Associated symptoms include blurred vision. Pertinent negatives include no chest pain, headaches, palpitations or shortness of breath. Risk factors for coronary artery disease include dyslipidemia, diabetes mellitus, family history, obesity, sedentary lifestyle and smoking/tobacco exposure. Past treatments include ACE inhibitors. Hypertensive end-organ damage includes kidney disease and CAD/MI.     Review of Systems  Constitutional:  Positive for fatigue. Negative for chills, fever and unexpected weight change.  HENT:  Negative for trouble swallowing and voice change.   Eyes:  Positive for blurred vision. Negative for visual disturbance.  Respiratory:  Negative for cough, shortness of breath and wheezing.   Cardiovascular:  Negative for chest pain, palpitations and leg swelling.  Gastrointestinal:  Negative for diarrhea, nausea and vomiting.  Endocrine: Positive for polydipsia and  polyuria. Negative for cold intolerance, heat intolerance and polyphagia.  Musculoskeletal:  Negative for arthralgias and myalgias.  Skin:  Negative for color change, pallor, rash and wound.  Neurological:  Negative for seizures and headaches.  Psychiatric/Behavioral:  Negative for confusion and suicidal ideas.     Objective:       06/27/2022    9:36 AM 06/23/2021    9:43 AM 05/17/2021    9:44 AM  Vitals with BMI  Height 5\' 5"  5\' 4"  5\' 3"   Weight 203 lbs 204 lbs 203 lbs  BMI 33.78 35 35.97  Systolic 144 169 [id number]  Diastolic 78 78 94  Pulse 88 75 76    BP (!) 144/78   Pulse 88   Ht 5\' 5"  (1.651 m)   Wt 203 lb (92.1 kg)   BMI 33.78 kg/m   Wt Readings from Last 3 Encounters:  06/27/22 203 lb (92.1 kg)  06/23/21 204 lb (92.5 kg)  05/17/21 203 lb (92.1 kg)     Physical Exam Constitutional:  Appearance: She is well-developed.  HENT:     Head: Normocephalic and atraumatic.     Mouth/Throat:     Comments: Partially edentulous, poor dentition. Neck:     Thyroid: No thyromegaly.     Trachea: No tracheal deviation.  Cardiovascular:     Rate and Rhythm: Normal rate.  Pulmonary:     Effort: Pulmonary effort is normal.  Abdominal:     Tenderness: There is no abdominal tenderness. There is no guarding.  Musculoskeletal:        General: Normal range of motion.     Cervical back: Normal range of motion and neck supple.  Skin:    General: Skin is warm and dry.     Coloration: Skin is not pale.     Findings: No erythema or rash.  Neurological:     Mental Status: She is alert and oriented to person, place, and time.     Cranial Nerves: No cranial nerve deficit.     Coordination: Coordination normal.     Deep Tendon Reflexes: Reflexes are normal and symmetric.  Psychiatric:        Judgment: Judgment normal.     Comments: Hesitant affect.      CMP     Component Value Date/Time   NA 139 04/19/2021 1807   K 3.6 04/19/2021 1807   CL 102 04/19/2021 1807   CO2 30  04/19/2021 1807   GLUCOSE 98 04/19/2021 1807   BUN 29 (H) 04/19/2021 1807   CREATININE 1.23 (H) 04/19/2021 1807   CREATININE 1.15 (H) 04/28/2016 0929   CALCIUM 9.4 04/19/2021 1807   GFRNONAA 46 (L) 04/19/2021 1807     Diabetic Labs (most recent): Lab Results  Component Value Date   HGBA1C 12.2 (H) 04/19/2021   HGBA1C 8.0 (H) 04/28/2016   HGBA1C 8.1 (H) 01/26/2016     Lipid Panel ( most recent) Lipid Panel     Component Value Date/Time   CHOL 156 01/26/2016 0858   TRIG 73 01/26/2016 0858   HDL 46 01/26/2016 0858   CHOLHDL 3.4 01/26/2016 0858   VLDL 15 01/26/2016 0858   LDLCALC 95 01/26/2016 0858      Lab Results  Component Value Date   TSH 0.71 01/26/2016   FREET4 1.2 01/26/2016      Assessment & Plan:   1. DM type 2 causing vascular disease (HCC)   - Yaritzy W Galasso has currently uncontrolled symptomatic type 2 DM since  76 years of age,  with most recent A1c of 11.8 %. Recent labs reviewed. - I had a long discussion with her about the possible risk factors and  the pathology behind its diabetes and its complications. -her diabetes is complicated by coronary artery disease, CKD, obesity, sedentary life, noncompliance/nonadherence and she remains at a high risk for more acute and chronic complications which include CAD, CVA, CKD, retinopathy, and neuropathy. These are all discussed in detail with her.  - I discussed all available options of managing her diabetes including de-escalation of medications. I have counseled her on Food as Medicine by adopting a Whole Food , Plant Predominant  ( WFPP) nutrition as recommended by [employer] of Lifestyle Medicine. Patient is encouraged to switch to  unprocessed or minimally processed  complex starch, adequate protein intake (mainly plant source), minimal liquid fat, plenty of fruits, and vegetables. -  she is advised to stick to a routine mealtimes to eat 3 complete meals a day and snack only when necessary ( to snack only  to correct hypoglycemia BG <70 day time or <100 at night).   - she acknowledges that there is a room for improvement in her food and drink choices. - Further Specific Suggestion is made for her to avoid simple carbohydrates  from her diet including Cakes, Sweet Desserts, Ice Cream, Soda (diet and regular), Sweet Tea, Candies, Chips, Cookies, Store Bought Juices, Alcohol ,  Artificial Sweeteners,  Coffee Creamer, and "Sugar-free" Products. This will help patient to have more stable blood glucose profile and potentially avoid unintended weight gain.  The following Lifestyle Medicine recommendations according to [facility]) were discussed and offered to patient and she agrees to start the journey:  A.  Whole Foods, Plant-based plate comprising of fruits and vegetables, plant-based proteins, whole-grain carbohydrates was discussed in detail with the patient.   A list for source of those nutrients were also provided to the patient.  Patient will use only water or unsweetened tea for hydration. B.  The need to stay away from risky substances including alcohol, smoking; obtaining 7 to 9 hours of restorative sleep, at least 150 minutes of moderate intensity exercise weekly, the importance of healthy social connections,  and stress reduction techniques were discussed.  - she will be scheduled with Jearld Fenton, RDN, CDE for individualized diabetes education.  - I have approached her with the following individualized plan to manage  her diabetes and patient agrees:   - she will likely need intensive treatment with basal/bolus insulin in order for her to achieve control of diabetes to target. - In order for this to happen, she has to commute for proper monitoring of blood glucose.   She is advised to monitor blood glucose 4 times a day-before meals and at bedtime and return in 15 days with her meter and logs.   She will be considered for a CGM device.   In the meantime, she  is advised to continue Lantus 60 units nightly, she is warned not to take insulin without proper monitoring per orders.  - she is encouraged to call clinic for blood glucose levels less than 70 or above 200 mg /dl. She does not tolerate metformin.  She will be considered for GLP-1 R agonists or prandial insulin depending on her presentation next visit. - Specific targets for  A1c;  LDL, HDL,  and Triglycerides were discussed with the patient.  2) Blood Pressure /Hypertension:  her blood pressure is un controlled to target.   she is advised to continue her current medications including lisinopril 40 mg p.o. daily with breakfast . 3) Lipids/Hyperlipidemia:   Review of her recent lipid panel showed uncontrolled  LDL at 93 .  she  is advised to continue    atorvastatin 20 mg daily at bedtime.  Side effects and precautions discussed with her.  4)  Weight/Diet:  Body mass index is 33.78 kg/m.  -   clearly complicating her diabetes care.   she is  a candidate for weight loss. I discussed with her the fact that loss of 5 - 10% of her  current body weight will have the most impact on her diabetes management.  The above detailed  ACLM recommendations for nutrition, exercise, sleep, social life, avoidance of risky substances, the need for restorative sleep   information will also detailed on discharge instructions.  5) Chronic Care/Health Maintenance:  -she  2 on ACEI/ARB and Statin medications and  is encouraged to initiate and continue to follow up with Ophthalmology, Dentist,  Podiatrist  at least yearly or according to recommendations, and advised to   stay away from smoking. I have recommended yearly flu vaccine and pneumonia vaccine at least every 5 years; moderate intensity exercise for up to 150 minutes weekly; and  sleep for 7- 9 hours a day.  - she is  advised to maintain close follow up with [name], MD for primary care needs, as well as her other providers for optimal and coordinated  care.   I spent 50 minutes in the care of the patient today including review of labs from CMP, Lipids, Thyroid Function, Hematology (current and previous including abstractions from other facilities); face-to-face time discussing  her blood glucose readings/logs, discussing hypoglycemia and hyperglycemia episodes and symptoms, medications doses, her options of short and long term treatment based on the latest standards of care / guidelines;  discussion about incorporating lifestyle medicine;  and documenting the encounter. Risk reduction counseling performed per USPSTF guidelines to reduce  obesity and cardiovascular risk factors.      Please refer to Patient Instructions for Blood Glucose Monitoring and Insulin/Medications Dosing Guide"  in media tab for additional information. Please  also refer to " Patient Self Inventory" in the Media  tab for reviewed elements of pertinent patient history.  Lajean Saver participated in the discussions, expressed understanding, and voiced agreement with the above plans.  All questions were answered to her satisfaction. she is encouraged to contact clinic should she have any questions or concerns prior to her return visit.   Follow up plan: - Return in about 2 weeks (around 07/11/2022) for F/U with Meter/CGM Megan Salon Only - no Labs.  Marquis Lunch, MD [facility] [address] [place], [place] [address] Phone: [phone]  Fax: [phone]    06/27/2022, 10:01 AM  This note was partially dictated with voice recognition software. Similar sounding words can be transcribed inadequately or may not  be corrected upon review.

## 2022-06-27 NOTE — Patient Instructions (Signed)

## 2022-07-12 ENCOUNTER — Ambulatory Visit (INDEPENDENT_AMBULATORY_CARE_PROVIDER_SITE_OTHER): Payer: Medicare PPO | Admitting: "Endocrinology

## 2022-07-12 ENCOUNTER — Encounter: Payer: Self-pay | Admitting: "Endocrinology

## 2022-07-12 VITALS — BP 140/76 | HR 76 | Ht 65.0 in | Wt 199.4 lb

## 2022-07-12 DIAGNOSIS — E1159 Type 2 diabetes mellitus with other circulatory complications: Secondary | ICD-10-CM

## 2022-07-12 DIAGNOSIS — Z91199 Patient's noncompliance with other medical treatment and regimen due to unspecified reason: Secondary | ICD-10-CM

## 2022-07-12 DIAGNOSIS — I10 Essential (primary) hypertension: Secondary | ICD-10-CM | POA: Diagnosis not present

## 2022-07-12 DIAGNOSIS — E782 Mixed hyperlipidemia: Secondary | ICD-10-CM

## 2022-07-12 LAB — POCT GLYCOSYLATED HEMOGLOBIN (HGB A1C): HbA1c, POC (controlled diabetic range): 10.8 % — AB (ref 0.0–7.0)

## 2022-07-12 MED ORDER — DEXCOM G6 SENSOR MISC: 4.0000 | 2 refills | Status: DC

## 2022-07-12 MED ORDER — DEXCOM G6 RECEIVER DEVI: 1.0000 | 0 refills | Status: DC | PRN

## 2022-07-12 MED ORDER — DEXCOM G6 TRANSMITTER MISC: 1.0000 | 1 refills | Status: DC

## 2022-07-12 MED ORDER — LANTUS SOLOSTAR 100 UNIT/ML ~~LOC~~ SOPN: 50.0000 [IU] | PEN_INJECTOR | Freq: Every day | SUBCUTANEOUS | 1 refills | Status: DC

## 2022-07-12 NOTE — Progress Notes (Signed)
Endocrinology Consult Note       07/12/2022, 5:15 PM   Subjective:    Patient ID: Robyn Crawford, female    DOB: 1945/11/28.  Robyn Crawford is being seen in consultation for management of currently uncontrolled symptomatic diabetes requested by  [name], MD.   Past Medical History:  Diagnosis Date   Diabetes mellitus, type II (HCC)    Heart attack (HCC)    Hyperlipidemia    Hypertension     Past Surgical History:  Procedure Laterality Date   ABDOMINAL HYSTERECTOMY     CORONARY ANGIOPLASTY WITH STENT PLACEMENT      Social History   Socioeconomic History   Marital status: Widowed    Spouse name: Not on file   Number of children: Not on file   Years of education: Not on file   Highest education level: Not on file  Occupational History   Not on file  Tobacco Use   Smoking status: Former   Smokeless tobacco: Never  Vaping Use   Vaping Use: Never used  Substance and Sexual Activity   Alcohol use: No    Alcohol/week: 0.0 standard drinks of alcohol   Drug use: No   Sexual activity: Not Currently    Birth control/protection: Abstinence  Other Topics Concern   Not on file  Social History Narrative   Not on file   Social Determinants of Health   Financial Resource Strain: Not on file  Food Insecurity: Not on file  Transportation Needs: Not on file  Physical Activity: Not on file  Stress: Not on file  Social Connections: Not on file    Family History  Problem Relation Age of Onset   Hypertension Mother    Thyroid disease Mother    Diabetes Father    Diabetes Sister    Diabetes Brother     Outpatient Encounter Medications as of 07/12/2022  Medication Sig   Continuous Blood Gluc Receiver (DEXCOM G6 RECEIVER) DEVI 1 Piece by Does not apply route as needed.   Continuous Blood Gluc Sensor (DEXCOM G6 SENSOR) MISC 4 Pieces by Does not apply route once a week.   Continuous  Blood Gluc Transmit (DEXCOM G6 TRANSMITTER) MISC 1 Piece by Does not apply route as directed.   amLODipine (NORVASC) 10 MG tablet TAKE 1 TABLET BY MOUTH  DAILY   aspirin EC 81 MG tablet Take 81 mg by mouth daily. Swallow whole.   atorvastatin (LIPITOR) 20 MG tablet TAKE 1 TABLET BY MOUTH  DAILY   azelastine (ASTELIN) 0.1 % nasal spray Place into both nostrils.   clopidogrel (PLAVIX) 75 MG tablet Take 75 mg by mouth daily.   fluticasone (FLONASE) 50 MCG/ACT nasal spray Place into both nostrils.   hydrochlorothiazide (HYDRODIURIL) 12.5 MG tablet TAKE 1 TABLET BY MOUTH  DAILY   insulin glargine (LANTUS SOLOSTAR) 100 UNIT/ML Solostar Pen Inject 50 Units into the skin at bedtime.   lisinopril (PRINIVIL,ZESTRIL) 40 MG tablet TAKE 1 TABLET BY MOUTH  DAILY   nitroGLYCERIN (NITROSTAT) 0.4 MG SL tablet Place under the tongue.   pregabalin (LYRICA) 50 MG capsule Take 50 mg by mouth 2 (  two) times daily.   [DISCONTINUED] insulin glargine (LANTUS SOLOSTAR) 100 UNIT/ML Solostar Pen Inject 60 Units into the skin at bedtime.   No facility-administered encounter medications on file as of 07/12/2022.    ALLERGIES: Allergies  Allergen Reactions   Codeine Other (See Comments)    VACCINATION STATUS:  There is no immunization history on file for this patient.  Diabetes She presents for her follow-up diabetic visit. She has type 2 diabetes mellitus. Onset time: She was diagnosed at approximate age of 51 years. Her disease course has been improving. There are no hypoglycemic associated symptoms. Pertinent negatives for hypoglycemia include no confusion, headaches, pallor or seizures. Associated symptoms include blurred vision and fatigue. Pertinent negatives for diabetes include no chest pain, no polydipsia, no polyphagia and no polyuria. There are no hypoglycemic complications. Symptoms are improving. Diabetic complications include heart disease, nephropathy and peripheral neuropathy. Risk factors for coronary  artery disease include diabetes mellitus, dyslipidemia, family history, obesity, hypertension, sedentary lifestyle, post-menopausal and tobacco exposure. Current diabetic treatment includes insulin injections. Her weight is fluctuating minimally. She is following a generally unhealthy diet. When asked about meal planning, she reported none. She never participates in exercise. Her home blood glucose trend is decreasing steadily. Her breakfast blood glucose range is generally 110-130 mg/dl. Her lunch blood glucose range is generally 110-130 mg/dl. Her dinner blood glucose range is generally 110-130 mg/dl. Her bedtime blood glucose range is generally 110-130 mg/dl. Her overall blood glucose range is 110-130 mg/dl. (She presents with dramatic improvement in her glycemic profile including mild hypoglycemia at fasting.  Her point-of-care A1c is 10.8%, improving from 11.8% during her last visit.    ) An ACE inhibitor/angiotensin II receptor blocker is being taken. Eye exam is current.  Hyperlipidemia This is a chronic problem. The current episode started more than 1 year ago. The problem is controlled. Exacerbating diseases include diabetes and obesity. Pertinent negatives include no chest pain, myalgias or shortness of breath. Current antihyperlipidemic treatment includes statins. Risk factors for coronary artery disease include dyslipidemia, diabetes mellitus, hypertension, obesity, post-menopausal and a sedentary lifestyle.  Hypertension Associated symptoms include blurred vision. Pertinent negatives include no chest pain, headaches, palpitations or shortness of breath. Risk factors for coronary artery disease include dyslipidemia, diabetes mellitus, family history, obesity, sedentary lifestyle and smoking/tobacco exposure. Past treatments include ACE inhibitors. Hypertensive end-organ damage includes kidney disease and CAD/MI.     Review of Systems  Constitutional:  Positive for fatigue. Negative for chills,  fever and unexpected weight change.  HENT:  Negative for trouble swallowing and voice change.   Eyes:  Positive for blurred vision. Negative for visual disturbance.  Respiratory:  Negative for cough, shortness of breath and wheezing.   Cardiovascular:  Negative for chest pain, palpitations and leg swelling.  Gastrointestinal:  Negative for diarrhea, nausea and vomiting.  Endocrine: Negative for cold intolerance, heat intolerance, polydipsia, polyphagia and polyuria.  Musculoskeletal:  Negative for arthralgias and myalgias.  Skin:  Negative for color change, pallor, rash and wound.  Neurological:  Negative for seizures and headaches.  Psychiatric/Behavioral:  Negative for confusion and suicidal ideas.     Objective:       07/12/2022   10:55 AM 06/27/2022    9:36 AM 06/23/2021    9:43 AM  Vitals with BMI  Height 5\' 5"  5\' 5"  5\' 4"   Weight 199 lbs 6 oz 203 lbs 204 lbs  BMI 33.18 33.78 35  Systolic 140 144  Diastolic 76 78 78  Pulse 76  88 75    BP (!) 140/76   Pulse 76   Ht 5\' 5"  (1.651 m)   Wt 199 lb 6.4 oz (90.4 kg)   BMI 33.18 kg/m   Wt Readings from Last 3 Encounters:  07/12/22 199 lb 6.4 oz (90.4 kg)  06/27/22 203 lb (92.1 kg)  06/23/21 204 lb (92.5 kg)       CMP     Component Value Date/Time   NA 141 02/11/2022 0000   K 4.2 02/11/2022 0000   CL 99 02/11/2022 0000   CO2 31 (A) 02/11/2022 0000   GLUCOSE 98 04/19/2021 1807   BUN 16 02/11/2022 0000   CREATININE 1.2 (A) 02/11/2022 0000   CREATININE 1.23 (H) 04/19/2021 1807   CREATININE 1.15 (H) 04/28/2016 0929   CALCIUM 9.9 05/19/2022 0000   ALBUMIN 4.3 05/19/2022 0000   AST 21 02/11/2022 0000   ALKPHOS 117 02/11/2022 0000   GFRNONAA 46 (L) 04/19/2021 1807     Diabetic Labs (most recent): Lab Results  Component Value Date   HGBA1C 10.8 (A) 07/12/2022   HGBA1C 12.8 02/10/2022   HGBA1C 12.2 (H) 04/19/2021     Lipid Panel ( most recent) Lipid Panel     Component Value Date/Time   CHOL 154  05/19/2022 0000   TRIG 66 05/19/2022 0000   HDL 46 05/19/2022 0000   CHOLHDL 3.4 01/26/2016 0858   VLDL 15 01/26/2016 0858   LDLCALC 93 05/19/2022 0000      Lab Results  Component Value Date   TSH 0.71 01/26/2016   FREET4 1.2 01/26/2016      Assessment & Plan:   1. DM type 2 causing vascular disease ([place])   - Robyn Crawford has currently uncontrolled symptomatic type 2 DM since  76 years of age. She presents with dramatic improvement in her glycemic profile including mild hypoglycemia at fasting.  Her point-of-care A1c is 10.8%, improving from 11.8% during her last visit.   Recent labs reviewed. - I had a long discussion with her about the possible risk factors and  the pathology behind its diabetes and its complications. -her diabetes is complicated by coronary artery disease, CKD, obesity, sedentary life, noncompliance/nonadherence and she remains at a high risk for more acute and chronic complications which include CAD, CVA, CKD, retinopathy, and neuropathy. These are all discussed in detail with her.  - I discussed all available options of managing her diabetes including de-escalation of medications. I have counseled her on Food as Medicine by adopting a Whole Food , Plant Predominant  ( WFPP) nutrition as recommended by [employer] of Lifestyle Medicine. Patient is encouraged to switch to  unprocessed or minimally processed  complex starch, adequate protein intake (mainly plant source), minimal liquid fat, plenty of fruits, and vegetables. -  she is advised to stick to a routine mealtimes to eat 3 complete meals a day and snack only when necessary ( to snack only to correct hypoglycemia BG <70 day time or <100 at night).   She is benefiting from lifestyle medicine. - she acknowledges that there is a room for improvement in her food and drink choices. - Further Specific Suggestion is made for her to avoid simple carbohydrates  from her diet including Cakes, Sweet Desserts,  Ice Cream, Soda (diet and regular), Sweet Tea, Candies, Chips, Cookies, Store Bought Juices, Alcohol ,  Artificial Sweeteners,  Coffee Creamer, and "Sugar-free" Products. This will help patient to have more stable blood glucose profile and potentially avoid unintended weight  gain.  The following Lifestyle Medicine recommendations according to American College of Lifestyle Medicine [facility]) were discussed and offered to patient and she agrees to start the journey:  A.  Whole Foods, Plant-based plate comprising of fruits and vegetables, plant-based proteins, whole-grain carbohydrates was discussed in detail with the patient.   A list for source of those nutrients were also provided to the patient.  Patient will use only water or unsweetened tea for hydration. B.  The need to stay away from risky substances including alcohol, smoking; obtaining 7 to 9 hours of restorative sleep, at least 150 minutes of moderate intensity exercise weekly, the importance of healthy social connections,  and stress reduction techniques were discussed.   - she will be scheduled with Norm Salt, RDN, CDE for individualized diabetes education.  - I have approached her with the following individualized plan to manage  her diabetes and patient agrees:   -Based on her presentation with average blood glucose of 125 over the last 7 days, she will not need prandial insulin for now.  She is advised to lower her Lantus to 50 units nightly, advised to continue monitoring blood glucose twice a day-daily before breakfast and at bedtime.  This patient will benefit from a CGM.  I discussed and prescribed the Dexcom device for her.   - she is encouraged to call clinic for blood glucose levels less than 70 or above 200 mg /dl. She does not tolerate metformin.  She will be considered for GLP-1 R agonists or prandial insulin depending on her presentation next visit. - Specific targets for  A1c;  LDL, HDL,  and Triglycerides were discussed  with the patient.  2) Blood Pressure /Hypertension:   Her blood pressure is not controlled to target.   she is advised to continue her current medications including lisinopril 40 mg p.o. daily with breakfast . 3) Lipids/Hyperlipidemia:   Review of her recent lipid panel showed uncontrolled  LDL at 93 .  she  is advised to continue atorvastatin 20 mg p.o. daily at bedtime. Side effects and precautions discussed with her.  4)  Weight/Diet:  Body mass index is 33.18 kg/m.  -   clearly complicating her diabetes care.   she is  a candidate for weight loss. I discussed with her the fact that loss of 5 - 10% of her  current body weight will have the most impact on her diabetes management.  The above detailed  ACLM recommendations for nutrition, exercise, sleep, social life, avoidance of risky substances, the need for restorative sleep   information will also detailed on discharge instructions.  5) Chronic Care/Health Maintenance:  -she  2 on ACEI/ARB and Statin medications and  is encouraged to initiate and continue to follow up with Ophthalmology, Dentist,  Podiatrist at least yearly or according to recommendations, and advised to   stay away from smoking. I have recommended yearly flu vaccine and pneumonia vaccine at least every 5 years; moderate intensity exercise for up to 150 minutes weekly; and  sleep for 7- 9 hours a day.  - she is  advised to maintain close follow up with [name], MD for primary care needs, as well as her other providers for optimal and coordinated care.   I spent 41 minutes in the care of the patient today including review of labs from CMP, Lipids, Thyroid Function, Hematology (current and previous including abstractions from other facilities); face-to-face time discussing  her blood glucose readings/logs, discussing hypoglycemia and hyperglycemia episodes and symptoms,  medications doses, her options of short and long term treatment based on the latest standards of care /  guidelines;  discussion about incorporating lifestyle medicine;  and documenting the encounter. Risk reduction counseling performed per USPSTF guidelines to reduce  obesity and cardiovascular risk factors.     Please refer to Patient Instructions for Blood Glucose Monitoring and Insulin/Medications Dosing Guide"  in media tab for additional information. Please  also refer to " Patient Self Inventory" in the Media  tab for reviewed elements of pertinent patient history.  Lajean Saverinah W Sternberg participated in the discussions, expressed understanding, and voiced agreement with the above plans.  All questions were answered to her satisfaction. she is encouraged to contact clinic should she have any questions or concerns prior to her return visit.   Follow up plan: - Return in about 3 months (around 10/12/2022) for F/U with Pre-visit Labs, Meter/CGM/Logs, A1c here.  Marquis LunchGebre Mackayla Mullins, MD [facility] [address] [place], [place] [address] Phone: [phone]  Fax: [phone]    07/12/2022, 5:15 PM  This note was partially dictated with voice recognition software. Similar sounding words can be transcribed inadequately or may not  be corrected upon review.

## 2022-07-12 NOTE — Patient Instructions (Signed)

## 2022-09-09 ENCOUNTER — Encounter: Payer: Self-pay | Admitting: Cardiovascular Disease

## 2022-10-13 ENCOUNTER — Ambulatory Visit: Payer: Medicare PPO | Admitting: "Endocrinology

## 2022-10-18 ENCOUNTER — Ambulatory Visit: Payer: Medicare PPO | Admitting: "Endocrinology

## 2022-11-07 NOTE — Progress Notes (Signed)
CARDIOLOGY CONSULT NOTE       Patient ID: Robyn Crawford MRN: [id number] DOB/AGE: Apr 16, 1946 77 y.o.  Admit date: (Not on file) Referring Physician: Bartolo Crawford Primary Physician: Robyn Schwalbe, MD Primary Cardiologist: Robyn Crawford   HPI:  77 y.o. referred by Robyn Robyn Crawford for chest pain  First seen 05/17/21 History of DM and HTN. D/C  from hospital 04/21/21 after falling off bed and sustaining fractures to right 4/5 th ribs. No associated pneumothorax Rx with oxycodone IS A1c was 12.2 and BS poorly controlled No previous history of vascular disease, syncope, palpitations or chest pain other than recent trauma  PAD screening recently no details other than ABI left 0.56 and right 0.86.  Vague mention in [place] on primaries note regarding history of CAD with stents managed by a Robyn Crawford   She had a card in her wallet indicated stents to Robyn Crawford 12/24/08 No angina She denies claudication despite moderate reduction in left ABI. 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   No angina Stable claudication   ROS All other systems reviewed and negative except as noted above  Past Medical History:  Diagnosis Date   Diabetes mellitus, type II ([place])    Heart attack ([place])    Hyperlipidemia    Hypertension     Family History  Problem Relation Age of Onset   Hypertension Mother    Thyroid disease Mother    Diabetes Father    Diabetes Sister    Diabetes Brother     Social History   Socioeconomic History   Marital status: Widowed    Spouse name: Not on file   Number of children: Not on file   Years of education: Not on file   Highest education level: Not on file  Occupational History   Not on file  Tobacco Use   Smoking status: Former   Smokeless tobacco: Never  Vaping Use   Vaping Use: Never used   Substance and Sexual Activity   Alcohol use: No    Alcohol/week: 0.0 standard drinks of alcohol   Drug use: No   Sexual activity: Not Currently    Birth control/protection: Abstinence  Other Topics Concern   Not on file  Social History Narrative   Not on file   Social Determinants of Health   Financial Resource Strain: Not on file  Food Insecurity: Not on file  Transportation Needs: Not on file  Physical Activity: Not on file  Stress: Not on file  Social Connections: Not on file  Intimate Partner Violence: Not on file    Past Surgical History:  Procedure Laterality Date   ABDOMINAL HYSTERECTOMY     CORONARY ANGIOPLASTY WITH STENT PLACEMENT        Current Outpatient Medications:    amLODipine (NORVASC) 10 MG tablet, TAKE 1 TABLET BY MOUTH  DAILY, Disp: 90 tablet, Rfl: 0   aspirin EC 81 MG tablet, Take 81 mg by mouth daily. Swallow whole., Disp: , Rfl:    atorvastatin (LIPITOR) 20 MG tablet, TAKE 1 TABLET BY MOUTH  DAILY, Disp: 90 tablet, Rfl: 0   azelastine (ASTELIN) 0.1 % nasal spray, Place into both nostrils., Disp: , Rfl:    clopidogrel (PLAVIX) 75 MG tablet, Take 75 mg by mouth daily., Disp: , Rfl:  Continuous Blood Gluc Receiver ([place]) DEVI, 1 Piece by Does not apply route as needed., Disp: 1 each, Rfl: 0   Continuous Blood Gluc Sensor (DEXCOM G6 SENSOR) MISC, 4 Pieces by Does not apply route once a week., Disp: 4 each, Rfl: 2   Continuous Blood Gluc Transmit (DEXCOM G6 TRANSMITTER) MISC, 1 Piece by Does not apply route as directed., Disp: 1 each, Rfl: 1   fluticasone (FLONASE) 50 MCG/ACT nasal spray, Place into both nostrils., Disp: , Rfl:    hydrochlorothiazide (HYDRODIURIL) 12.5 MG tablet, TAKE 1 TABLET BY MOUTH  DAILY, Disp: 90 tablet, Rfl: 0   insulin glargine (LANTUS SOLOSTAR) 100 UNIT/ML Solostar Pen, Inject 50 Units into the skin at bedtime., Disp: 30 mL, Rfl: 1   lisinopril (PRINIVIL,ZESTRIL) 40 MG tablet, TAKE 1 TABLET BY MOUTH  DAILY, Disp: 90  tablet, Rfl: 0   nitroGLYCERIN (NITROSTAT) 0.4 MG SL tablet, Place under the tongue., Disp: , Rfl:    pregabalin (LYRICA) 50 MG capsule, Take 50 mg by mouth 2 (two) times daily., Disp: , Rfl:     Physical Exam: Blood pressure (!) 148/72, pulse 91, height '5\' 4"'$  (1.626 m), weight 199 lb 3.2 oz (90.4 kg), SpO2 94 %.    Affect appropriate Obese black female  HEENT: normal Neck supple with no adenopathy JVP normal left bruits no thyromegaly Lungs clear with no wheezing and good diaphragmatic motion Heart:  S1/S2 no murmur, no rub, gallop or click PMI normal Abdomen: benighn, BS positve, no tenderness, no AAA no bruit.  No HSM or HJR Decreased PT/DP on left  No edema Neuro non-focal Pain palpation over right 4/5 th ribs    Labs:   Lab Results  Component Value Date   WBC 8.0 04/19/2021   HGB 13.1 04/19/2021   HCT 41.7 04/19/2021   MCV 91.4 04/19/2021   PLT 260 04/19/2021   No results for input(s): "NA", "K", "CL", "CO2", "BUN", "CREATININE", "CALCIUM", "PROT", "BILITOT", "ALKPHOS", "ALT", "AST", "GLUCOSE" in the last 168 hours.  Invalid input(s): "LABALBU" No results found for: "CKTOTAL", "CKMB", "CKMBINDEX", "TROPONINI"  Lab Results  Component Value Date   CHOL 154 05/19/2022   CHOL 156 01/26/2016   Lab Results  Component Value Date   HDL 46 05/19/2022   HDL 46 01/26/2016   Lab Results  Component Value Date   LDLCALC 93 05/19/2022   [place] 95 01/26/2016   Lab Results  Component Value Date   TRIG 66 05/19/2022   TRIG 73 01/26/2016   Lab Results  Component Value Date   CHOLHDL 3.4 01/26/2016   No results found for: "LDLDIRECT"    Radiology: No results found.  EKG: 11/21/2022 NSR rate 91 RBBB    ASSESSMENT AND PLAN:   Chest Pain:  from  rib fractures not cardiac. Stable history of stents to RCA 2010 continue medical Rx HTN:  Well controlled.  Continue current medications and low sodium Dash type diet.   DM:  Discussed low carb diet.  Target hemoglobin  A1c is 6.5 or less.  Continue current medications. Although A1c improved to 10.8 from 12.2 still very poor control  HLD:  continue statin labs with primary last LDL 93 on Lipitor  PVD:  duplex 05/25/21 1-49% bilateral plaque  Seen by Early but his note does not address decreased ABI"s  will update   Resting ABI"s Carotid duplex  F/U Robyn Dorris Fetch for Diabetes  F/U Early [facility]  F/U US in  a year   Signed: Jenkins Rouge  11/21/2022, 11:04 AM

## 2022-11-17 ENCOUNTER — Ambulatory Visit: Payer: Medicare HMO | Admitting: Cardiovascular Disease

## 2022-11-21 ENCOUNTER — Encounter: Payer: Self-pay | Admitting: Cardiovascular Disease

## 2022-11-21 ENCOUNTER — Ambulatory Visit: Payer: Medicare PPO | Attending: Cardiovascular Disease | Admitting: Cardiovascular Disease

## 2022-11-21 VITALS — BP 148/72 | HR 91 | Ht 64.0 in | Wt 199.2 lb

## 2022-11-21 DIAGNOSIS — I10 Essential (primary) hypertension: Secondary | ICD-10-CM | POA: Diagnosis not present

## 2022-11-21 DIAGNOSIS — R0989 Other specified symptoms and signs involving the circulatory and respiratory systems: Secondary | ICD-10-CM

## 2022-11-21 DIAGNOSIS — I2510 Atherosclerotic heart disease of native coronary artery without angina pectoris: Secondary | ICD-10-CM

## 2022-11-21 DIAGNOSIS — I739 Peripheral vascular disease, unspecified: Secondary | ICD-10-CM | POA: Diagnosis not present

## 2022-11-21 NOTE — Patient Instructions (Signed)
Medication Instructions:  Your physician recommends that you continue on your current medications as directed. Please refer to the Current Medication list given to you today.  *If you need a refill on your cardiac medications before your next appointment, please call your pharmacy*   Lab Work: NONE   If you have labs (blood work) drawn today and your tests are completely normal, you will receive your results only by: [place] (if you have MyChart) OR A paper copy in the mail If you have any lab test that is abnormal or we need to change your treatment, we will call you to review the results.   Testing/Procedures: Your physician has requested that you have an ankle brachial index (ABI). During this test an ultrasound and blood pressure cuff are used to evaluate the arteries that supply the arms and legs with blood. Allow thirty minutes for this exam. There are no restrictions or special instructions.  Your physician has requested that you have a carotid duplex. This test is an ultrasound of the carotid arteries in your neck. It looks at blood flow through these arteries that supply the brain with blood. Allow one hour for this exam. There are no restrictions or special instructions.    Follow-Up: At [facility], you and your health needs are our priority.  As part of our continuing mission to provide you with exceptional heart care, we have created designated Provider Care Teams.  These Care Teams include your primary Cardiologist (physician) and Advanced Practice Providers (APPs -  Physician Assistants and Nurse Practitioners) who all work together to provide you with the care you need, when you need it.  We recommend signing up for the patient portal called "MyChart".  Sign up information is provided on this After Visit Summary.  MyChart is used to connect with patients for Virtual Visits (Telemedicine).  Patients are able to view lab/test results, encounter notes, upcoming  appointments, etc.  Non-urgent messages can be sent to your provider as well.   To learn more about what you can do with MyChart, go to [email or web address].    Your next appointment:   1 year(s)  Provider:   You may see Jenkins Rouge, MD or one of the following Advanced Practice Providers on your designated Care Team:   Bernerd Pho, PA-C  Ermalinda Barrios, PA-C     Other Instructions Thank you for choosing [facility]!

## 2022-12-02 ENCOUNTER — Ambulatory Visit (HOSPITAL_COMMUNITY): Payer: Medicare PPO

## 2022-12-11 IMAGING — CR DG CHEST 2V
2 series · 2 of 2 positions shown · non-contrast
Comparison: Chest radiograph 04/20/2021 and CT chest 04/19/2021.

CLINICAL DATA: Right lateral rib and chest pain after falling out
of the bed over the weekend, initial encounter.

EXAM:
CHEST - 2 VIEW

[w chest lat]
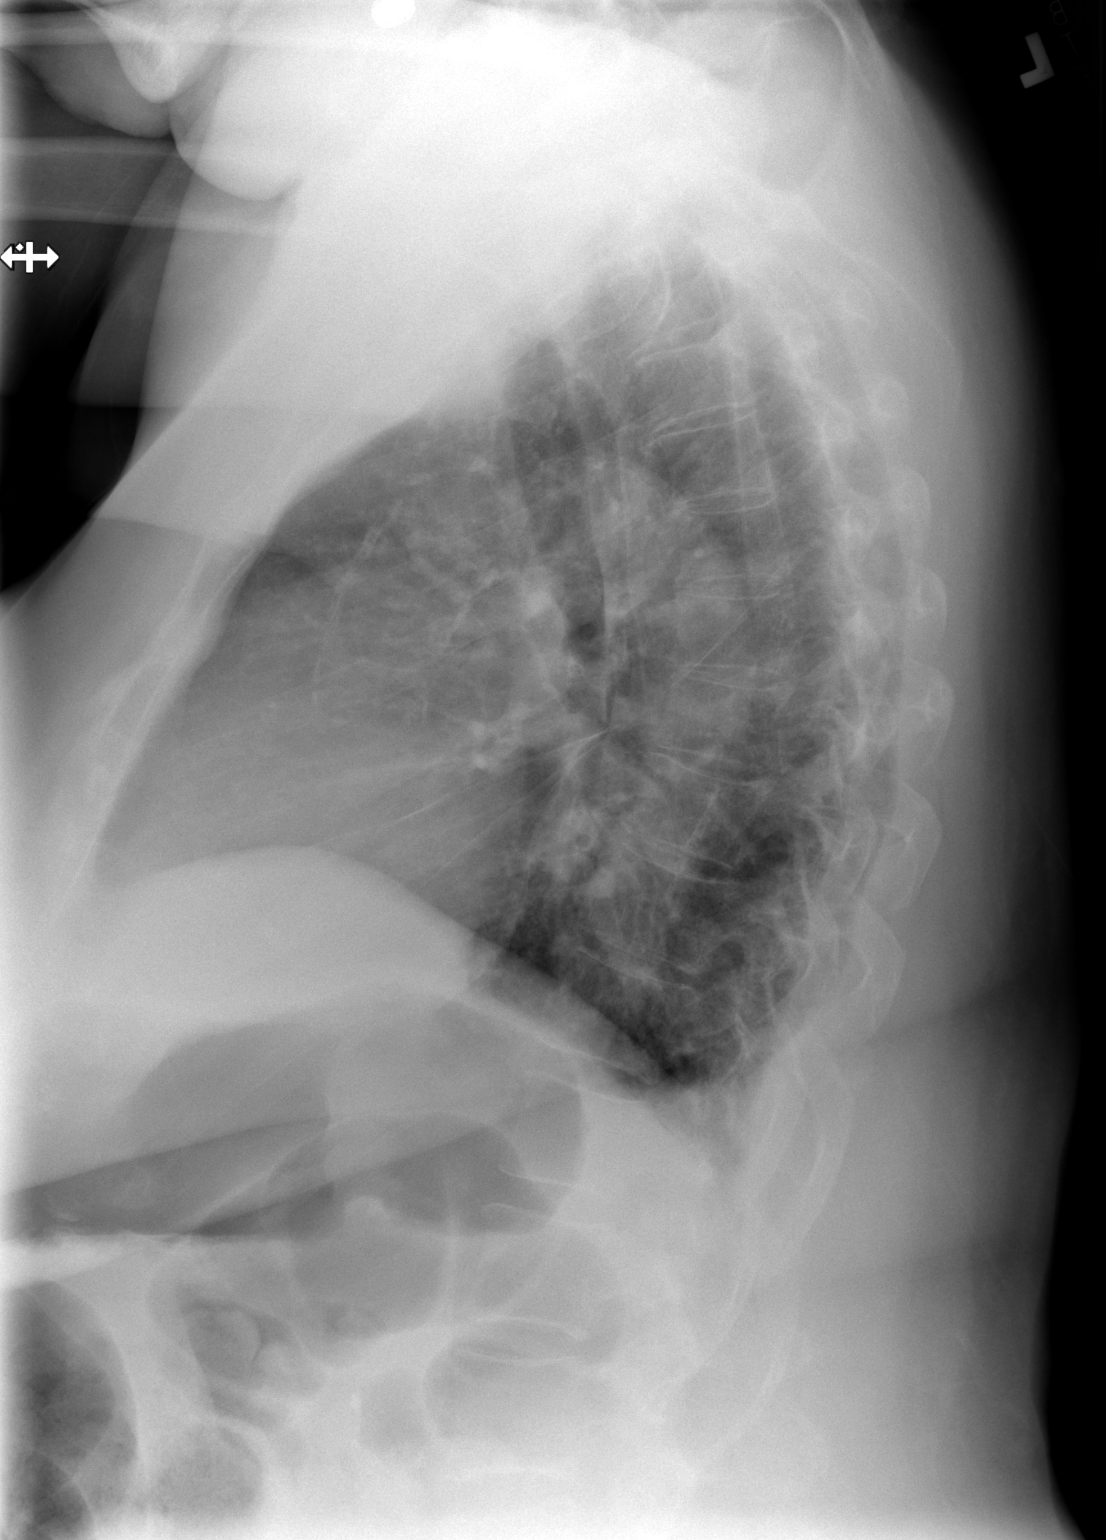

[w pa chest]
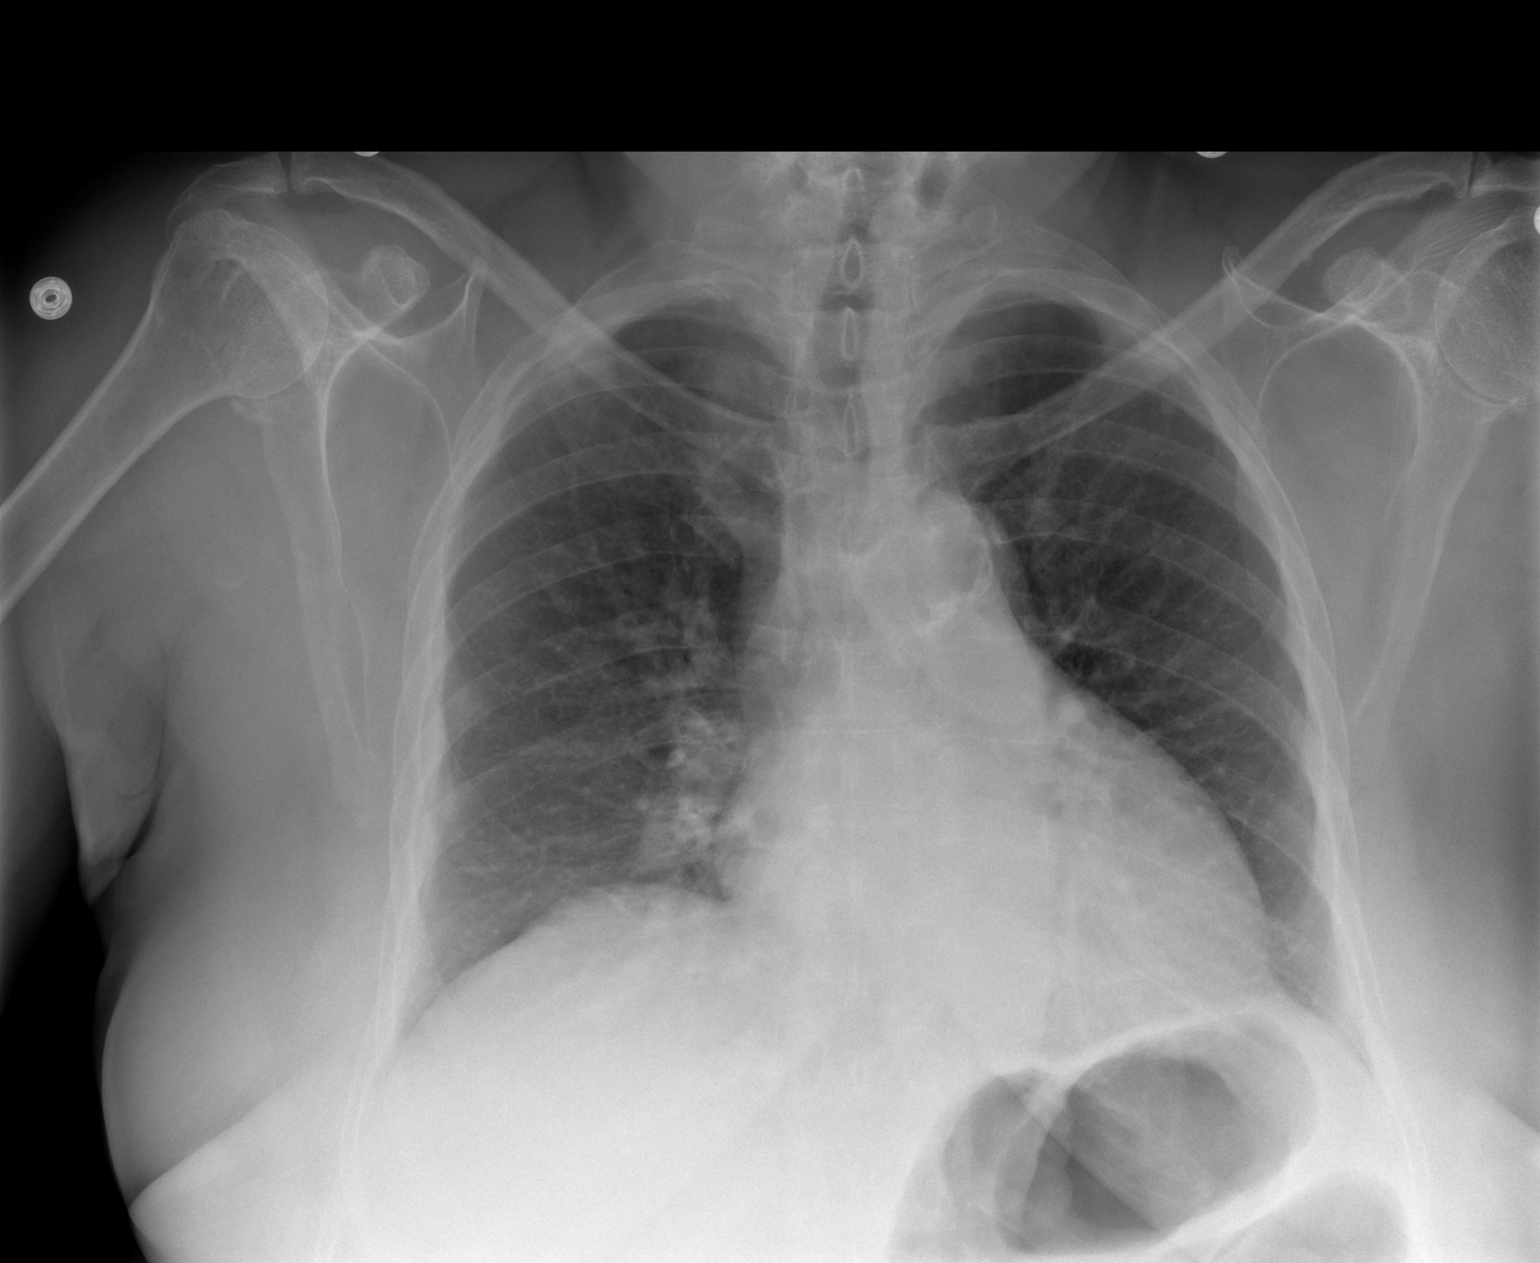

[2 of 2 positions shown; findings below may reference images not displayed]

FINDINGS: Trachea is midline. Heart size stable. Thoracic aorta is calcified.
Slight lateral pleural thickening associated with acute right rib
fractures, better seen on 04/19/2021. Minimal subsegmental
atelectasis in the lung bases. Lungs are otherwise clear. There may
be trace right pleural fluid. No pneumothorax.
IMPRESSION: 1. Possible trace right pleural fluid.  No pneumothorax.
2. Nondisplaced right rib fractures, better seen on 04/19/2021.
3.  Aortic atherosclerosis ([id number]).

## 2022-12-14 ENCOUNTER — Other Ambulatory Visit: Payer: Self-pay

## 2022-12-14 DIAGNOSIS — E782 Mixed hyperlipidemia: Secondary | ICD-10-CM

## 2022-12-14 DIAGNOSIS — E1159 Type 2 diabetes mellitus with other circulatory complications: Secondary | ICD-10-CM

## 2022-12-15 LAB — COMPREHENSIVE METABOLIC PANEL
ALT: 20 IU/L (ref 0–32)
AST: 26 IU/L (ref 0–40)
Albumin/Globulin Ratio: 1.8 (ref 1.2–2.2)
Albumin: 4.6 g/dL (ref 3.8–4.8)
Alkaline Phosphatase: 135 IU/L — ABNORMAL HIGH (ref 44–121)
BUN/Creatinine Ratio: 11 — ABNORMAL LOW (ref 12–28)
BUN: 13 mg/dL (ref 8–27)
Bilirubin Total: 0.7 mg/dL (ref 0.0–1.2)
CO2: 28 mmol/L (ref 20–29)
Calcium: 10.3 mg/dL (ref 8.7–10.3)
Chloride: 99 mmol/L (ref 96–106)
Creatinine, Ser: 1.15 mg/dL — ABNORMAL HIGH (ref 0.57–1.00)
Globulin, Total: 2.5 g/dL (ref 1.5–4.5)
Glucose: 103 mg/dL — ABNORMAL HIGH (ref 70–99)
Potassium: 4.1 mmol/L (ref 3.5–5.2)
Sodium: 144 mmol/L (ref 134–144)
Total Protein: 7.1 g/dL (ref 6.0–8.5)
eGFR: 50 mL/min/{1.73_m2} — ABNORMAL LOW (ref >59)

## 2022-12-15 LAB — LIPID PANEL
Chol/HDL Ratio: 3.1 ratio (ref 0.0–4.4)
Cholesterol, Total: 167 mg/dL (ref 100–199)
HDL: 54 mg/dL (ref >39)
LDL Chol Calc (NIH): 101 mg/dL — ABNORMAL HIGH (ref 0–99)
Triglycerides: 63 mg/dL (ref 0–149)
VLDL Cholesterol Cal: 12 mg/dL (ref 5–40)

## 2022-12-15 LAB — VITAMIN D 25 HYDROXY (VIT D DEFICIENCY, FRACTURES): Vit D, 25-Hydroxy: 14.2 ng/mL — ABNORMAL LOW (ref 30.0–100.0)

## 2022-12-15 LAB — VITAMIN B12: Vitamin B-12: 604 pg/mL (ref 232–1245)

## 2022-12-15 LAB — T4, FREE: Free T4: 1.49 ng/dL (ref 0.82–1.77)

## 2022-12-15 LAB — TSH: TSH: 0.804 u[IU]/mL (ref 0.450–4.500)

## 2022-12-26 HISTORY — PX: CATARACT EXTRACTION: SUR2

## 2023-01-05 ENCOUNTER — Encounter: Payer: Self-pay | Admitting: "Endocrinology

## 2023-01-05 ENCOUNTER — Ambulatory Visit: Payer: Medicare PPO | Admitting: "Endocrinology

## 2023-01-05 VITALS — BP 152/74 | HR 92 | Ht 64.0 in | Wt 203.4 lb

## 2023-01-05 DIAGNOSIS — E782 Mixed hyperlipidemia: Secondary | ICD-10-CM

## 2023-01-05 DIAGNOSIS — E6609 Other obesity due to excess calories: Secondary | ICD-10-CM

## 2023-01-05 DIAGNOSIS — E1159 Type 2 diabetes mellitus with other circulatory complications: Secondary | ICD-10-CM

## 2023-01-05 DIAGNOSIS — I10 Essential (primary) hypertension: Secondary | ICD-10-CM | POA: Diagnosis not present

## 2023-01-05 DIAGNOSIS — E559 Vitamin D deficiency, unspecified: Secondary | ICD-10-CM

## 2023-01-05 DIAGNOSIS — Z6833 Body mass index (BMI) 33.0-33.9, adult: Secondary | ICD-10-CM

## 2023-01-05 LAB — POCT GLYCOSYLATED HEMOGLOBIN (HGB A1C): HbA1c, POC (controlled diabetic range): 10.7 % — AB (ref 0.0–7.0)

## 2023-01-05 MED ORDER — VITAMIN D3 125 MCG (5000 UT) PO CAPS: 5000.0000 [IU] | ORAL_CAPSULE | Freq: Every day | ORAL | 1 refills | Status: DC

## 2023-01-05 MED ORDER — LANTUS SOLOSTAR 100 UNIT/ML ~~LOC~~ SOPN: 50.0000 [IU] | PEN_INJECTOR | Freq: Every day | SUBCUTANEOUS | 1 refills | Status: DC

## 2023-01-05 MED ORDER — DEXCOM G6 SENSOR MISC: 4.0000 | 2 refills | Status: DC

## 2023-01-05 MED ORDER — EMPAGLIFLOZIN 10 MG PO TABS: 10.0000 mg | ORAL_TABLET | Freq: Every day | ORAL | 1 refills | Status: DC

## 2023-01-05 MED ORDER — DEXCOM G6 TRANSMITTER MISC: 1.0000 | 1 refills | Status: DC

## 2023-01-05 NOTE — Progress Notes (Signed)
01/05/2023, 8:34 PM  Endocrinology follow-up note   Subjective:    Patient ID: Robyn Crawford, female    DOB: 05/02/1946.  Robyn Crawford is being seen in follow-up after she was sitting consultation for management of currently uncontrolled symptomatic diabetes requested by  Robyn Robert, MD (Inactive).   Past Medical History:  Diagnosis Date   Diabetes mellitus, type II    Heart attack    Hyperlipidemia    Hypertension     Past Surgical History:  Procedure Laterality Date   ABDOMINAL HYSTERECTOMY     CATARACT EXTRACTION Bilateral 12/2022   CORONARY ANGIOPLASTY WITH STENT PLACEMENT      Social History   Socioeconomic History   Marital status: Widowed    Spouse name: Not on file   Number of children: Not on file   Years of education: Not on file   Highest education level: Not on file  Occupational History   Not on file  Tobacco Use   Smoking status: Former   Smokeless tobacco: Never  Vaping Use   Vaping Use: Never used  Substance and Sexual Activity   Alcohol use: No    Alcohol/week: 0.0 standard drinks of alcohol   Drug use: No   Sexual activity: Not Currently    Birth control/protection: Abstinence  Other Topics Concern   Not on file  Social History Narrative   Not on file   Social Determinants of Health   Financial Resource Strain: Not on file  Food Insecurity: Not on file  Transportation Needs: Not on file  Physical Activity: Not on file  Stress: Not on file  Social Connections: Not on file    Family History  Problem Relation Age of Onset   Hypertension Mother    Thyroid disease Mother    Diabetes Father    Diabetes Sister    Diabetes Brother     Outpatient Encounter Medications as of 01/05/2023  Medication Sig   Cholecalciferol (VITAMIN D3) 125 MCG (5000 UT) capsule Take 1 capsule (5,000 Units total) by mouth daily.   empagliflozin (JARDIANCE) 10 MG  TABS tablet Take 1 tablet (10 mg total) by mouth daily before breakfast.   amLODipine (NORVASC) 10 MG tablet TAKE 1 TABLET BY MOUTH  DAILY   aspirin EC 81 MG tablet Take 81 mg by mouth daily. Swallow whole.   atorvastatin (LIPITOR) 20 MG tablet TAKE 1 TABLET BY MOUTH  DAILY   azelastine (ASTELIN) 0.1 % nasal spray Place into both nostrils.   clopidogrel (PLAVIX) 75 MG tablet Take 75 mg by mouth daily.   Continuous Blood Gluc Receiver (DEXCOM G6 RECEIVER) DEVI 1 Piece by Does not apply route as needed.   Continuous Blood Gluc Sensor (DEXCOM G6 SENSOR) MISC 4 Pieces by Does not apply route once a week.   Continuous Blood Gluc Transmit (DEXCOM G6 TRANSMITTER) MISC 1 Piece by Does not apply route as directed.   fluticasone (FLONASE) 50 MCG/ACT nasal spray Place into both nostrils.   hydrochlorothiazide (HYDRODIURIL) 12.5 MG tablet TAKE 1 TABLET BY MOUTH  DAILY   insulin glargine (LANTUS SOLOSTAR) 100 UNIT/ML Solostar  Pen Inject 50 Units into the skin at bedtime.   lisinopril (PRINIVIL,ZESTRIL) 40 MG tablet TAKE 1 TABLET BY MOUTH  DAILY   nitroGLYCERIN (NITROSTAT) 0.4 MG SL tablet Place under the tongue.   pregabalin (LYRICA) 50 MG capsule Take 50 mg by mouth 2 (two) times daily.   [DISCONTINUED] Continuous Blood Gluc Sensor (DEXCOM G6 SENSOR) MISC 4 Pieces by Does not apply route once a week.   [DISCONTINUED] Continuous Blood Gluc Transmit (DEXCOM G6 TRANSMITTER) MISC 1 Piece by Does not apply route as directed.   [DISCONTINUED] insulin glargine (LANTUS SOLOSTAR) 100 UNIT/ML Solostar Pen Inject 50 Units into the skin at bedtime. (Patient taking differently: Inject 60 Units into the skin at bedtime.)   No facility-administered encounter medications on file as of 01/05/2023.    ALLERGIES: Allergies  Allergen Reactions   Codeine Other (See Comments)    VACCINATION STATUS:  There is no immunization history on file for this patient.  Diabetes She presents for her follow-up diabetic visit. She  has type 2 diabetes mellitus. Onset time: She was diagnosed at approximate age of 77 years. Her disease course has been improving. There are no hypoglycemic associated symptoms. Pertinent negatives for hypoglycemia include no confusion, headaches, pallor or seizures. Associated symptoms include blurred vision and fatigue. Pertinent negatives for diabetes include no chest pain, no polydipsia, no polyphagia and no polyuria. There are no hypoglycemic complications. Symptoms are improving. Diabetic complications include heart disease, nephropathy and peripheral neuropathy. Risk factors for coronary artery disease include diabetes mellitus, dyslipidemia, family history, obesity, hypertension, sedentary lifestyle, post-menopausal and tobacco exposure. Current diabetic treatment includes insulin injections. Her weight is fluctuating minimally. She is following a generally unhealthy diet. When asked about meal planning, she reported none. She never participates in exercise. Her home blood glucose trend is fluctuating minimally. Her breakfast blood glucose range is generally 110-130 mg/dl. Her overall blood glucose range is 110-130 mg/dl. (She did not get her CGM.  Her meter average is 105 for the last 30 days at fasting.  Her point-of-care A1c is 10.7%, unchanged from her last visit A1c.  She has increased her Lantus to 60 units nightly.   ) An ACE inhibitor/angiotensin II receptor blocker is being taken. Eye exam is current.  Hyperlipidemia This is a chronic problem. The current episode started more than 1 year ago. The problem is controlled. Exacerbating diseases include diabetes and obesity. Pertinent negatives include no chest pain, myalgias or shortness of breath. Current antihyperlipidemic treatment includes statins. Risk factors for coronary artery disease include dyslipidemia, diabetes mellitus, hypertension, obesity, post-menopausal and a sedentary lifestyle.  Hypertension Associated symptoms include blurred  vision. Pertinent negatives include no chest pain, headaches, palpitations or shortness of breath. Risk factors for coronary artery disease include dyslipidemia, diabetes mellitus, family history, obesity, sedentary lifestyle and smoking/tobacco exposure. Past treatments include ACE inhibitors. Hypertensive end-organ damage includes kidney disease and CAD/MI.     Review of Systems  Constitutional:  Positive for fatigue. Negative for chills, fever and unexpected weight change.  HENT:  Negative for trouble swallowing and voice change.   Eyes:  Positive for blurred vision. Negative for visual disturbance.  Respiratory:  Negative for cough, shortness of breath and wheezing.   Cardiovascular:  Negative for chest pain, palpitations and leg swelling.  Gastrointestinal:  Negative for diarrhea, nausea and vomiting.  Endocrine: Negative for cold intolerance, heat intolerance, polydipsia, polyphagia and polyuria.  Musculoskeletal:  Negative for arthralgias and myalgias.  Skin:  Negative for color change, pallor, rash and  wound.  Neurological:  Negative for seizures and headaches.  Psychiatric/Behavioral:  Negative for confusion and suicidal ideas.     Objective:       01/05/2023    3:54 PM 01/05/2023    3:42 PM 11/21/2022   10:58 AM  Vitals with BMI  Height  5\' 4"  5\' 4"   Weight  203 lbs 6 oz 199 lbs 3 oz  BMI  34.9 34.18  Systolic 152 148 062  Diastolic 74 78 72  Pulse  92 91    BP (!) 152/74 Comment: L arm with manuel cuff  Pulse 92   Ht 5\' 4"  (1.626 m)   Wt 203 lb 6.4 oz (92.3 kg)   BMI 34.91 kg/m   Wt Readings from Last 3 Encounters:  01/05/23 203 lb 6.4 oz (92.3 kg)  11/21/22 199 lb 3.2 oz (90.4 kg)  07/12/22 199 lb 6.4 oz (90.4 kg)       CMP     Component Value Date/Time   NA 144 12/14/2022 0929   K 4.1 12/14/2022 0929   CL 99 12/14/2022 0929   CO2 28 12/14/2022 0929   GLUCOSE 103 (H) 12/14/2022 0929   GLUCOSE 98 04/19/2021 1807   BUN 13 12/14/2022 0929   CREATININE  1.15 (H) 12/14/2022 0929   CREATININE 1.15 (H) 04/28/2016 0929   CALCIUM 10.3 12/14/2022 0929   PROT 7.1 12/14/2022 0929   ALBUMIN 4.6 12/14/2022 0929   AST 26 12/14/2022 0929   ALT 20 12/14/2022 0929   ALKPHOS 135 (H) 12/14/2022 0929   BILITOT 0.7 12/14/2022 0929   GFRNONAA 46 (L) 04/19/2021 1807     Diabetic Labs (most recent): Lab Results  Component Value Date   HGBA1C 10.7 (A) 01/05/2023   HGBA1C 10.8 (A) 07/12/2022   HGBA1C 12.8 02/10/2022     Lipid Panel ( most recent) Lipid Panel     Component Value Date/Time   CHOL 167 12/14/2022 0929   TRIG 63 12/14/2022 0929   HDL 54 12/14/2022 0929   CHOLHDL 3.1 12/14/2022 0929   CHOLHDL 3.4 01/26/2016 0858   VLDL 15 01/26/2016 0858   LDLCALC 101 (H) 12/14/2022 0929   LABVLDL 12 12/14/2022 0929      Lab Results  Component Value Date   TSH 0.804 12/14/2022   TSH 0.71 01/26/2016   FREET4 1.49 12/14/2022   FREET4 1.2 01/26/2016      Assessment & Plan:   1. DM type 2 causing vascular disease (HCC)   - Burlene W Corwin has currently uncontrolled symptomatic type 2 DM since  77 years of age.  She did not get her CGM.  Her meter average is 105 for the last 30 days at fasting.  Her point-of-care A1c is 10.7%, unchanged from her last visit A1c.  She has increased her Lantus to 60 units nightly.    Recent labs reviewed. - I had a long discussion with her about the possible risk factors and  the pathology behind its diabetes and its complications. -her diabetes is complicated by coronary artery disease, CKD, obesity, sedentary life, noncompliance/nonadherence and she remains at a high risk for more acute and chronic complications which include CAD, CVA, CKD, retinopathy, and neuropathy. These are all discussed in detail with her. - I discussed all available options of managing her diabetes including de-escalation of medications. I have counseled her on Food as Medicine by adopting a Whole Food , Plant Predominant  ( WFPP)  nutrition as recommended by [employer] of Lifestyle Medicine.  Patient is encouraged to switch to  unprocessed or minimally processed  complex starch, adequate protein intake (mainly plant source), minimal liquid fat, plenty of fruits, and vegetables.  -  she is advised to stick to a routine mealtimes to eat 3 complete meals a day and snack only when necessary ( to snack only to correct hypoglycemia BG <70 day time or <100 at night).   -She did not engage optimally with lifestyle medicine.  However she wishes to have the plan.   - she acknowledges that there is a room for improvement in her food and drink choices. - Further Specific Suggestion is made for her to avoid simple carbohydrates  from her diet including Cakes, Sweet Desserts, Ice Cream, Soda (diet and regular), Sweet Tea, Candies, Chips, Cookies, Store Bought Juices, Alcohol ,  Artificial Sweeteners,  Coffee Creamer, and "Sugar-free" Products. This will help patient to have more stable blood glucose profile and potentially avoid unintended weight gain.  The following Lifestyle Medicine recommendations according to American College of Lifestyle Medicine [facility]) were discussed and offered to patient and she agrees to start the journey:  A.  Whole Foods, Plant-based plate comprising of fruits and vegetables, plant-based proteins, whole-grain carbohydrates was discussed in detail with the patient.   A list for source of those nutrients were also provided to the patient.  Patient will use only water or unsweetened tea for hydration. B.  The need to stay away from risky substances including alcohol, smoking; obtaining 7 to 9 hours of restorative sleep, at least 150 minutes of moderate intensity exercise weekly, the importance of healthy social connections,  and stress reduction techniques were discussed.  - she will be scheduled with Norm Salt, RDN, CDE for individualized diabetes education.  - I have approached her with the following  individualized plan to manage  her diabetes and patient agrees:   -Based on her presentation with tight glycemic profile at fasting, she is advised to lower her Lantus to 50 units nightly.  She is advised to monitor blood glucose at least twice a day-before breakfast and at bedtime.   I will send a prescription for Dexcom once again to her pharmacy.   - she is encouraged to call clinic for blood glucose levels less than 70 or above 200 mg /dl. She does not tolerate metformin.  To address postprandial hyperglycemia, I discussed initiated Jardiance 10 mg p.o. daily at breakfast.  Side effects and precautions discussed with her.    - Specific targets for  A1c;  LDL, HDL,  and Triglycerides were discussed with the patient.  2) Blood Pressure /Hypertension:   Her blood pressure is not controlled to target.   she is advised to continue her current medications including lisinopril 40 mg p.o. daily with breakfast .  Salt restriction is  discussed with her. 3) Lipids/Hyperlipidemia:   Review of her recent lipid panel showed worsening LDL at 101.  She is advised to continue atorvastatin 20 mg p.o. daily at bedtime.  Whole food plant-based diet will also help with dyslipidemia.   Side effects and precautions discussed with her.  4)  Weight/Diet:  Body mass index is 34.91 kg/m.  -   clearly complicating her diabetes care.   she is  a candidate for weight loss. I discussed with her the fact that loss of 5 - 10% of her  current body weight will have the most impact on her diabetes management.  The above detailed  ACLM recommendations for nutrition, exercise, sleep, social  life, avoidance of risky substances, the need for restorative sleep   information will also detailed on discharge instructions.  5) Chronic Care/Health Maintenance:  -she  2 on ACEI/ARB and Statin medications and  is encouraged to initiate and continue to follow up with Ophthalmology, Dentist,  Podiatrist at least yearly or according to  recommendations, and advised to   stay away from smoking. I have recommended yearly flu vaccine and pneumonia vaccine at least every 5 years; moderate intensity exercise for up to 150 minutes weekly; and  sleep for 7- 9 hours a day.  - she is  advised to maintain close follow up with Robyn RobertKikel, Stephen, MD (Inactive) for primary care needs, as well as her other providers for optimal and coordinated care.   I spent  26  minutes in the care of the patient today including review of labs from CMP, Lipids, Thyroid Function, Hematology (current and previous including abstractions from other facilities); face-to-face time discussing  her blood glucose readings/logs, discussing hypoglycemia and hyperglycemia episodes and symptoms, medications doses, her options of short and long term treatment based on the latest standards of care / guidelines;  discussion about incorporating lifestyle medicine;  and documenting the encounter. Risk reduction counseling performed per USPSTF guidelines to reduce  obesity and cardiovascular risk factors.     Please refer to Patient Instructions for Blood Glucose Monitoring and Insulin/Medications Dosing Guide"  in media tab for additional information. Please  also refer to " Patient Self Inventory" in the Media  tab for reviewed elements of pertinent patient history.  Lajean Saverinah W Andaya participated in the discussions, expressed understanding, and voiced agreement with the above plans.  All questions were answered to her satisfaction. she is encouraged to contact clinic should she have any questions or concerns prior to her return visit.    Follow up plan: - Return in about 4 months (around 05/07/2023) for Bring Meter/CGM Device/Logs- A1c in Office.  Marquis LunchGebre Yoneko Talerico, MD [facility] [address] [place], [place] [address] Phone: [phone]  Fax: [phone]    01/05/2023, 8:34 PM  This note was partially dictated with voice  recognition software. Similar sounding words can be transcribed inadequately or may not  be corrected upon review.

## 2023-01-05 NOTE — Patient Instructions (Signed)

## 2023-01-14 IMAGING — US US CAROTID DUPLEX BILAT
1 series · 13 of 24 positions shown · non-contrast
Comparison: None.

CLINICAL DATA: Carotid bruit

EXAM:
BILATERAL CAROTID DUPLEX ULTRASOUND
TECHNIQUE: Gray scale imaging, color Doppler and duplex ultrasound were
performed of bilateral carotid and vertebral arteries in the neck.

[Series 1: us carotid bilateral · 13 of 69 slices shown]
[im 1/69]
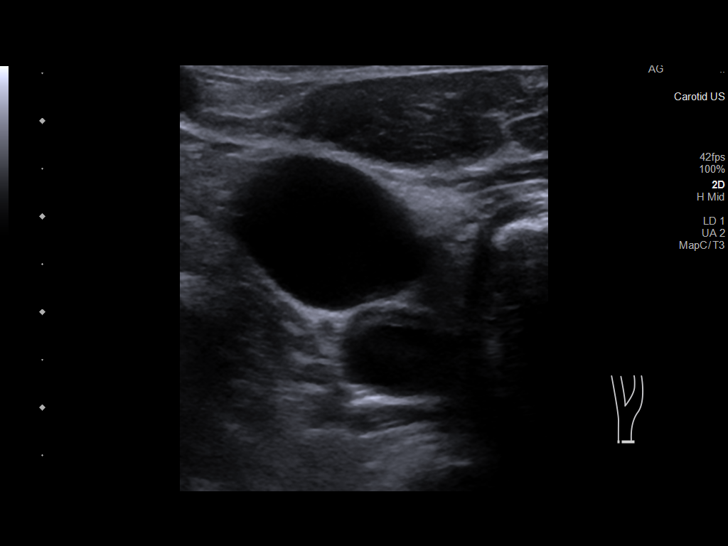
[im 6/69]
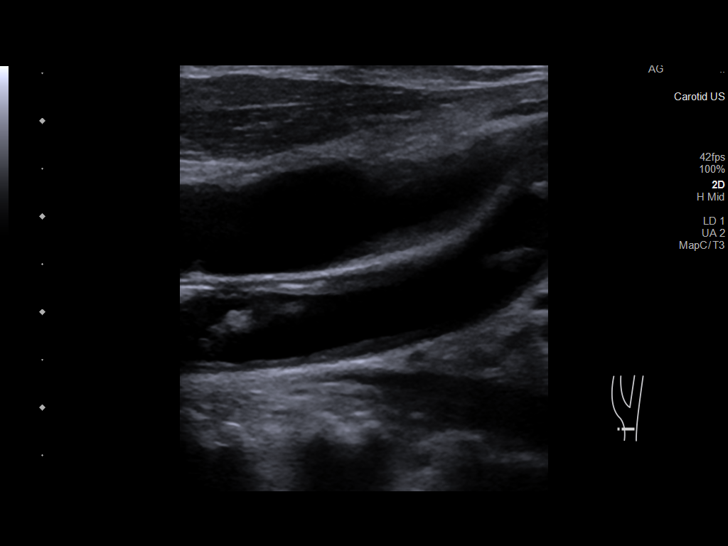
[im 12/69]
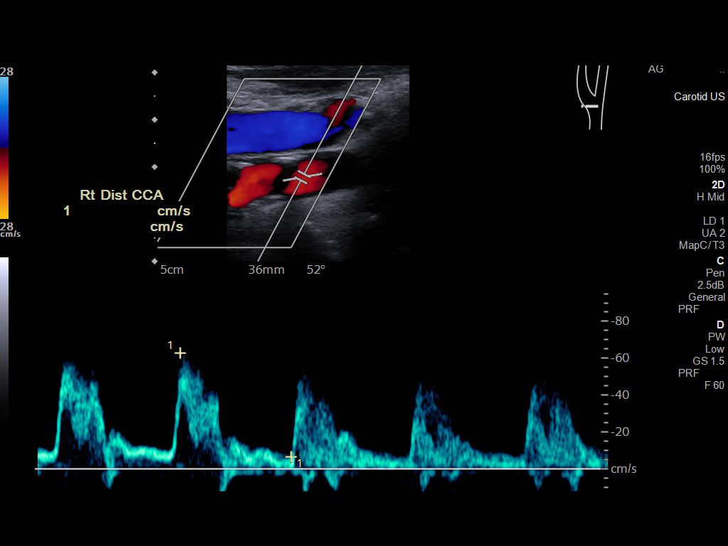
[im 18/69]
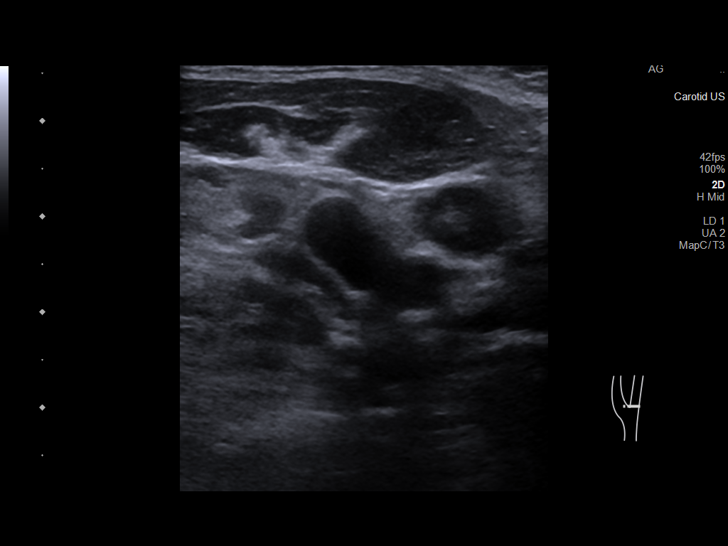
[im 24/69]
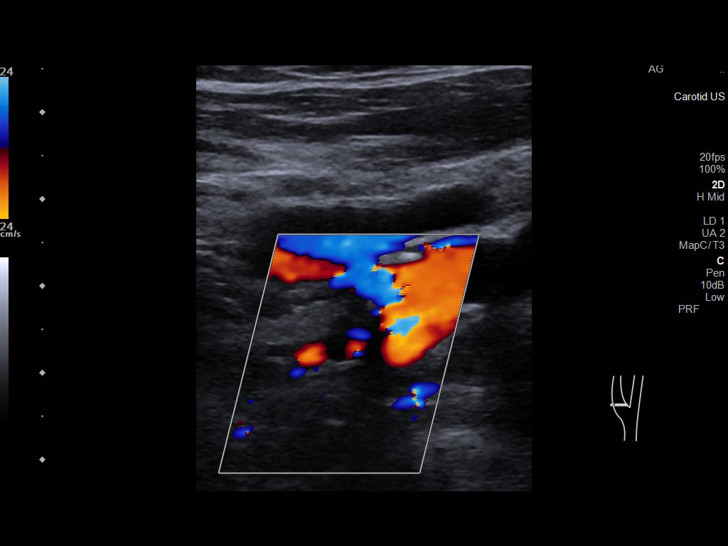
[im 30/69]
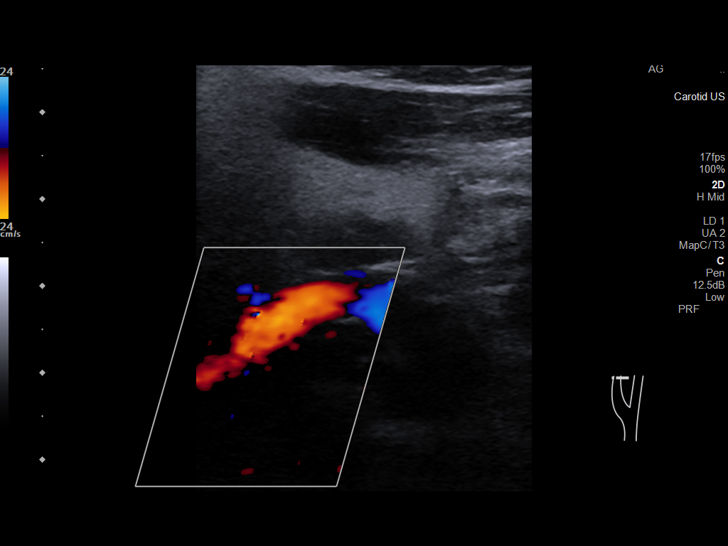
[im 36/69]
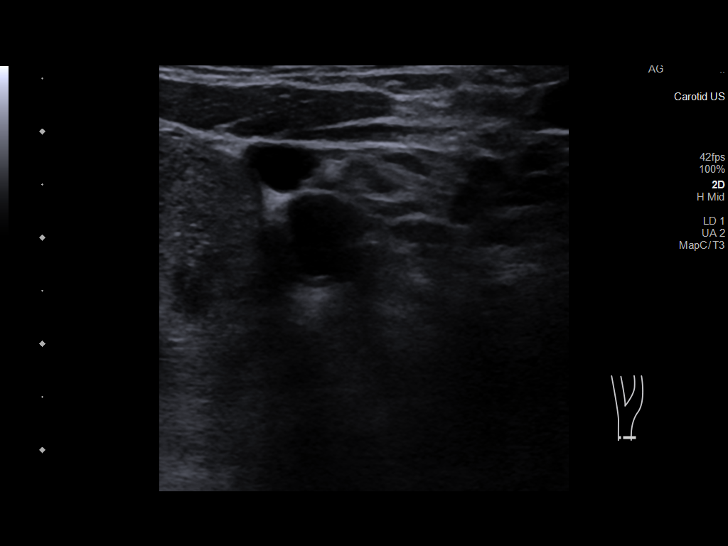
[im 39/69]
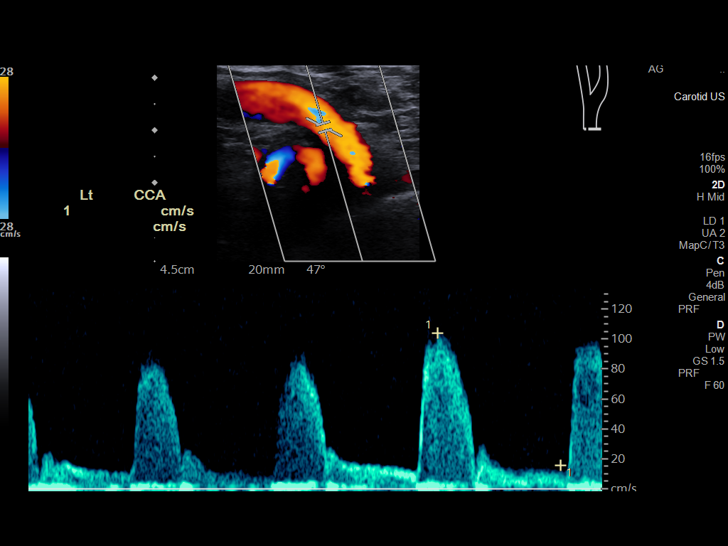
[im 45/69]
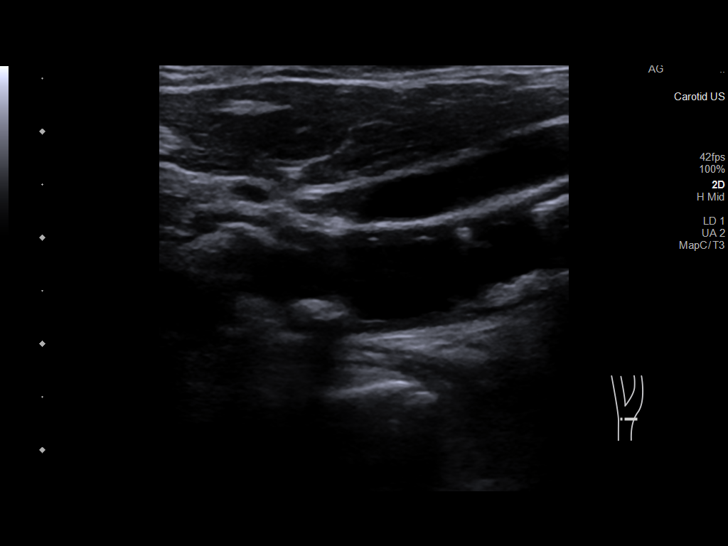
[im 51/69]
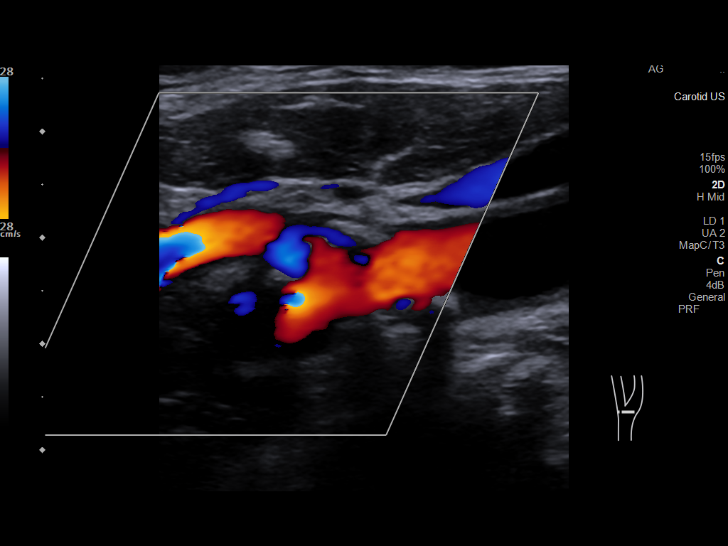
[im 57/69]
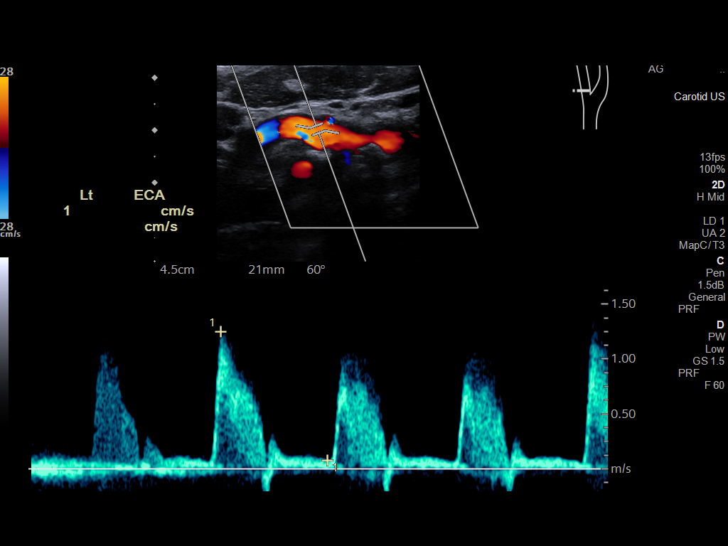
[im 63/69]
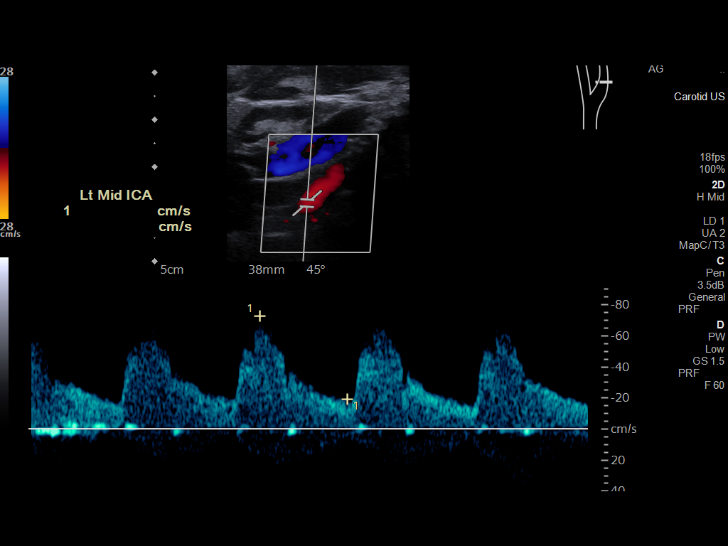
[im 69/69]
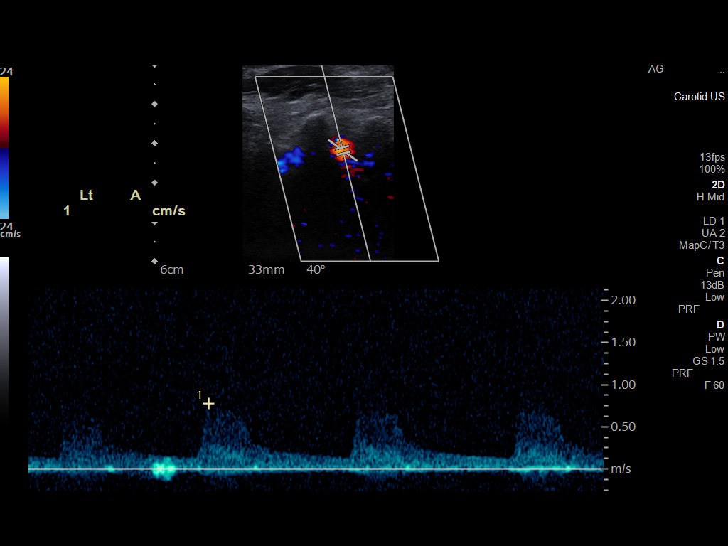

[13 of 24 positions shown; findings below may reference images not displayed]

FINDINGS: Criteria: Quantification of carotid stenosis is based on velocity
parameters that correlate the residual internal carotid diameter
with NASCET-based stenosis levels, using the diameter of the distal
internal carotid lumen as the denominator for stenosis measurement.

The following velocity measurements were obtained:

RIGHT
ICA: 110/25 cm/sec
CCA: 69/10 cm/sec

SYSTOLIC ICA/CCA RATIO:

ECA:  151 cm/sec

LEFT

ICA: 130/35 cm/sec

CCA: 93/15 cm/sec

SYSTOLIC ICA/CCA RATIO:

ECA:  125 cm/sec

RIGHT CAROTID ARTERY: Heterogeneous and irregular atherosclerotic
plaque throughout the common carotid artery extending into the
proximal internal and external carotid arteries. By peak systolic
velocity criteria, the estimated stenosis in the proximal internal
carotid artery is less than 50%.

RIGHT VERTEBRAL ARTERY:  Patent with normal antegrade flow.

LEFT CAROTID ARTERY: Heterogeneous and irregular atherosclerotic
plaque in the common carotid artery extending into the proximal
internal and external carotid arteries. By peak systolic velocity
criteria, the estimated stenosis remains less than 50%. Greater
elevation of the peak systolic velocity in the distal and tortuous
internal carotid artery is favored to be spurious in nature.

LEFT VERTEBRAL ARTERY:  Patent with normal antegrade flow.
IMPRESSION: 1. Mild (1-49%) stenosis proximal right internal carotid artery
secondary to heterogeneous and irregular/ulcerated atherosclerotic
plaque.
2. Mild (1-49%) stenosis proximal left internal carotid artery
secondary to heterogeneous and irregular/ulcerated atherosclerotic
plaque.
3. Vertebral arteries are patent with normal antegrade flow.

## 2023-01-23 ENCOUNTER — Ambulatory Visit (HOSPITAL_COMMUNITY): Admission: RE | Admit: 2023-01-23 | Payer: Medicare PPO | Source: Ambulatory Visit

## 2023-03-08 ENCOUNTER — Other Ambulatory Visit: Payer: Self-pay

## 2023-03-08 MED ORDER — EMPAGLIFLOZIN 10 MG PO TABS: 10.0000 mg | ORAL_TABLET | Freq: Every day | ORAL | 1 refills | Status: DC

## 2023-04-20 ENCOUNTER — Other Ambulatory Visit: Payer: Self-pay | Admitting: "Endocrinology

## 2023-05-16 ENCOUNTER — Ambulatory Visit: Payer: Medicare PPO | Admitting: "Endocrinology

## 2023-06-01 ENCOUNTER — Encounter: Payer: Self-pay | Admitting: "Endocrinology

## 2023-06-01 ENCOUNTER — Ambulatory Visit: Payer: Medicare PPO | Admitting: "Endocrinology

## 2023-06-01 VITALS — BP 138/62 | HR 72 | Ht 64.0 in | Wt 193.2 lb

## 2023-06-01 DIAGNOSIS — E782 Mixed hyperlipidemia: Secondary | ICD-10-CM | POA: Diagnosis not present

## 2023-06-01 DIAGNOSIS — Z6833 Body mass index (BMI) 33.0-33.9, adult: Secondary | ICD-10-CM

## 2023-06-01 DIAGNOSIS — E6609 Other obesity due to excess calories: Secondary | ICD-10-CM

## 2023-06-01 DIAGNOSIS — Z794 Long term (current) use of insulin: Secondary | ICD-10-CM

## 2023-06-01 DIAGNOSIS — I10 Essential (primary) hypertension: Secondary | ICD-10-CM

## 2023-06-01 DIAGNOSIS — E1159 Type 2 diabetes mellitus with other circulatory complications: Secondary | ICD-10-CM

## 2023-06-01 LAB — POCT GLYCOSYLATED HEMOGLOBIN (HGB A1C): HbA1c, POC (controlled diabetic range): 10.6 % — AB (ref 0.0–7.0)

## 2023-06-01 NOTE — Patient Instructions (Signed)

## 2023-06-01 NOTE — Progress Notes (Signed)
06/01/2023, 6:26 PM  Endocrinology follow-up note   Subjective:    Patient ID: Robyn Crawford, female    DOB: [birth year].  Robyn Crawford is being seen in follow-up after she was sitting consultation for management of currently uncontrolled symptomatic diabetes requested by  Robyn Robert, MD.   Past Medical History:  Diagnosis Date   Diabetes mellitus, type II (HCC)    Heart attack (HCC)    Hyperlipidemia    Hypertension     Past Surgical History:  Procedure Laterality Date   ABDOMINAL HYSTERECTOMY     CATARACT EXTRACTION Bilateral 12/2022   CORONARY ANGIOPLASTY WITH STENT PLACEMENT      Social History   Socioeconomic History   Marital status: Widowed    Spouse name: Robyn Crawford   Number of children: Robyn Crawford   Years of education: Robyn Crawford   Highest education level: Robyn Crawford  Occupational History   Robyn Crawford  Tobacco Use   Smoking status: Former   Smokeless tobacco: Never  Vaping Use   Vaping status: Never Used  Substance and Sexual Activity   Alcohol use: No    Alcohol/week: 0.0 standard drinks of alcohol   Drug use: No   Sexual activity: Robyn Currently    Birth control/protection: Abstinence  Other Topics Concern   Robyn Crawford  Social History Narrative   Robyn Crawford   Social Determinants of Health   Financial Resource Strain: Robyn Crawford  Food Insecurity: Robyn Crawford  Transportation Needs: Robyn Crawford  Physical Activity: Robyn Crawford  Stress: Robyn Crawford  Social Connections: Robyn Crawford    Family History  Problem Relation Age of Onset   Hypertension Mother    Thyroid disease Mother    Diabetes Father    Diabetes Sister    Diabetes Brother     Outpatient Encounter Medications as of 06/01/2023  Medication Sig   amLODipine (NORVASC) 10 MG tablet TAKE 1 TABLET BY MOUTH  DAILY   aspirin EC 81 MG tablet Take 81 mg by mouth daily. Swallow whole.    atorvastatin (LIPITOR) 20 MG tablet TAKE 1 TABLET BY MOUTH  DAILY   azelastine (ASTELIN) 0.1 % nasal spray Place into both nostrils.   Cholecalciferol (VITAMIN D3) 125 MCG (5000 UT) capsule Take 1 capsule (5,000 Units total) by mouth daily.   clopidogrel (PLAVIX) 75 MG tablet Take 75 mg by mouth daily.   Continuous Blood Gluc Receiver (DEXCOM G6 RECEIVER) DEVI 1 Piece by Does Robyn apply route as needed.   Continuous Blood Gluc Sensor (DEXCOM G6 SENSOR) MISC 4 Pieces by Does Robyn apply route once a week.   Continuous Blood Gluc Transmit (DEXCOM G6 TRANSMITTER) MISC 1 Piece by Does Robyn apply route as directed.   empagliflozin (JARDIANCE) 10 MG TABS tablet Take 1 tablet (10 mg total) by mouth daily before breakfast.   fluticasone (FLONASE) 50 MCG/ACT nasal spray Place into both nostrils.   hydrochlorothiazide (HYDRODIURIL) 12.5 MG tablet TAKE 1 TABLET BY MOUTH  DAILY   insulin glargine (LANTUS SOLOSTAR) 100 UNIT/ML  Solostar Pen Inject 50 Units into the skin at bedtime.   Insulin Pen Needle (ULTICARE SHORT PEN NEEDLES) 31G X 8 MM MISC USE AS DIRECTED   lisinopril (PRINIVIL,ZESTRIL) 40 MG tablet TAKE 1 TABLET BY MOUTH  DAILY   nitroGLYCERIN (NITROSTAT) 0.4 MG SL tablet Place under the tongue.   pregabalin (LYRICA) 50 MG capsule Take 50 mg by mouth 2 (two) times daily.   No facility-administered encounter medications on Crawford as of 06/01/2023.    ALLERGIES: Allergies  Allergen Reactions   Codeine Other (See Comments)    VACCINATION STATUS:  There is no immunization history on Crawford for this patient.  Diabetes She presents for her follow-up diabetic visit. She has type 2 diabetes mellitus. Onset time: She was diagnosed at approximate age of 77 years. Her disease course has been worsening. There are no hypoglycemic associated symptoms. Pertinent negatives for hypoglycemia include no confusion, headaches, pallor or seizures. Associated symptoms include blurred vision and fatigue. Pertinent negatives  for diabetes include no chest pain, no polydipsia, no polyphagia and no polyuria. There are no hypoglycemic complications. Symptoms are worsening. Diabetic complications include heart disease, nephropathy and peripheral neuropathy. Risk factors for coronary artery disease include diabetes mellitus, dyslipidemia, family history, obesity, hypertension, sedentary lifestyle, post-menopausal and tobacco exposure. Current diabetic treatment includes insulin injections. She is compliant with treatment none of the time. Her weight is fluctuating minimally. She is following a generally unhealthy diet. When asked about meal planning, she reported none. She never participates in exercise. Her overall blood glucose range is >200 mg/dl. (She did Robyn use her CGM even though she has it at home.  She presents with a meter showing 3 readings in the last 5 days of September current 126, 157, 189.  Prior to that.  Last monitoring was in May.  2024.  She has Robyn monitor blood glucose between April 11 and September 3rd. .  Her point-of-care A1c is 10.6% today unchanged from her last visit A1c of 10.7%.  She claims to have been using her Lantus 50 units nightly along with her Jardiance 10 mg p.o. daily.) An ACE inhibitor/angiotensin II receptor blocker is being taken. Eye exam is current.  Hyperlipidemia This is a chronic problem. The current episode started more than 1 year ago. The problem is controlled. Exacerbating diseases include diabetes and obesity. Pertinent negatives include no chest pain, myalgias or shortness of breath. Current antihyperlipidemic treatment includes statins. Risk factors for coronary artery disease include dyslipidemia, diabetes mellitus, hypertension, obesity, post-menopausal and a sedentary lifestyle.  Hypertension Associated symptoms include blurred vision. Pertinent negatives include no chest pain, headaches, palpitations or shortness of breath. Risk factors for coronary artery disease include  dyslipidemia, diabetes mellitus, family history, obesity, sedentary lifestyle and smoking/tobacco exposure. Past treatments include ACE inhibitors. Hypertensive end-organ damage includes kidney disease and CAD/MI.     Review of Systems  Constitutional:  Positive for fatigue. Negative for chills, fever and unexpected weight change.  HENT:  Negative for trouble swallowing and voice change.   Eyes:  Positive for blurred vision. Negative for visual disturbance.  Respiratory:  Negative for cough, shortness of breath and wheezing.   Cardiovascular:  Negative for chest pain, palpitations and leg swelling.  Gastrointestinal:  Negative for diarrhea, nausea and vomiting.  Endocrine: Negative for cold intolerance, heat intolerance, polydipsia, polyphagia and polyuria.  Musculoskeletal:  Negative for arthralgias and myalgias.  Skin:  Negative for color change, pallor, rash and wound.  Neurological:  Negative for seizures and headaches.  Psychiatric/Behavioral:  Negative for confusion and suicidal ideas.     Objective:       06/01/2023   11:22 AM 01/05/2023    3:54 PM 01/05/2023    3:42 PM  Vitals with BMI  Height 5\' 4"   5\' 4"   Weight 193 lbs 3 oz  203 lbs 6 oz  BMI 33.15  34.9  Systolic 138 152 161  Diastolic 62 74 78  Pulse 72  92    BP 138/62   Pulse 72   Ht 5\' 4"  (1.626 m)   Wt 193 lb 3.2 oz (87.6 kg)   BMI 33.16 kg/m   Wt Readings from Last 3 Encounters:  06/01/23 193 lb 3.2 oz (87.6 kg)  01/05/23 203 lb 6.4 oz (92.3 kg)  11/21/22 199 lb 3.2 oz (90.4 kg)       CMP     Component Value Date/Time   NA 144 12/14/2022 0929   K 4.1 12/14/2022 0929   CL 99 12/14/2022 0929   CO2 28 12/14/2022 0929   GLUCOSE 103 (H) 12/14/2022 0929   GLUCOSE 98 04/19/2021 1807   BUN 13 12/14/2022 0929   CREATININE 1.15 (H) 12/14/2022 0929   CREATININE 1.15 (H) 04/28/2016 0929   CALCIUM 10.3 12/14/2022 0929   PROT 7.1 12/14/2022 0929   ALBUMIN 4.6 12/14/2022 0929   AST 26 12/14/2022 0929    ALT 20 12/14/2022 0929   ALKPHOS 135 (H) 12/14/2022 0929   BILITOT 0.7 12/14/2022 0929   GFRNONAA 46 (L) 04/19/2021 1807     Diabetic Labs (most recent): Lab Results  Component Value Date   HGBA1C 10.6 (A) 06/01/2023   HGBA1C 10.7 (A) 01/05/2023   HGBA1C 10.8 (A) 07/12/2022     Lipid Panel ( most recent) Lipid Panel     Component Value Date/Time   CHOL 167 12/14/2022 0929   TRIG 63 12/14/2022 0929   HDL 54 12/14/2022 0929   CHOLHDL 3.1 12/14/2022 0929   CHOLHDL 3.4 01/26/2016 0858   VLDL 15 01/26/2016 0858   LDLCALC 101 (H) 12/14/2022 0929   LABVLDL 12 12/14/2022 0929      Lab Results  Component Value Date   TSH 0.804 12/14/2022   TSH 0.71 01/26/2016   FREET4 1.49 12/14/2022   FREET4 1.2 01/26/2016      Assessment & Plan:   1. DM type 2 causing vascular disease (HCC)   - Jewelia W Cassella has currently uncontrolled symptomatic type 2 DM since  77 years of age.  She did Robyn use her CGM even though she has it at home.  She presents with a meter showing 3 readings in the last 5 days of September current 126, 157, 189.  Prior to that.  Last monitoring was in May.  2024.  She has Robyn monitor blood glucose between April 11 and September 3rd. .  Her point-of-care A1c is 10.6% today unchanged from her last visit A1c of 10.7%.  She claims to have been using her Lantus 50 units nightly along with her Jardiance 10 mg p.o. daily.   Recent labs reviewed. - I had a long discussion with her about the possible risk factors and  the pathology behind its diabetes and its complications. -her diabetes is complicated by coronary artery disease, CKD, obesity, sedentary life, noncompliance/nonadherence and she remains at a high risk for more acute and chronic complications which include CAD, CVA, CKD, retinopathy, and neuropathy. These are all discussed in detail with her. - I discussed all available options of managing  her diabetes including de-escalation of medications. I have counseled  her on Food as Medicine by adopting a Whole Food , Plant Predominant  ( WFPP) nutrition as recommended by [employer] of Lifestyle Medicine. Patient is encouraged to switch to  unprocessed or minimally processed  complex starch, adequate protein intake (mainly plant source), minimal liquid fat, plenty of fruits, and vegetables.  -  she is advised to stick to a routine mealtimes to eat 3 complete meals a day and snack only when necessary ( to snack only to correct hypoglycemia BG <70 day time or <100 at night).   -She did Robyn engage optimally with lifestyle medicine.  However she wishes to have the plan.   - she acknowledges that there is a room for improvement in her food and drink choices. - Further Specific Suggestion is made for her to avoid simple carbohydrates  from her diet including Cakes, Sweet Desserts, Ice Cream, Soda (diet and regular), Sweet Tea, Candies, Chips, Cookies, Store Bought Juices, Alcohol ,  Artificial Sweeteners,  Coffee Creamer, and "Sugar-free" Products. This will help patient to have more stable blood glucose profile and potentially avoid unintended weight gain.  The following Lifestyle Medicine recommendations according to American College of Lifestyle Medicine [facility]) were discussed and offered to patient and she agrees to start the journey:  A.  Whole Foods, Plant-based plate comprising of fruits and vegetables, plant-based proteins, whole-grain carbohydrates was discussed in detail with the patient.   A list for source of those nutrients were also provided to the patient.  Patient will use only water or unsweetened tea for hydration. B.  The need to stay away from risky substances including alcohol, smoking; obtaining 7 to 9 hours of restorative sleep, at least 150 minutes of moderate intensity exercise weekly, the importance of healthy social connections,  and stress reduction techniques were discussed.   - I have approached her with the following individualized  plan to manage  her diabetes and patient agrees:   -Ms. Jefferson remains alarmingly noncompliant.  She will have needed multiple daily injections of insulin to control her glycemia to target.  However, she does Robyn engage for proper monitoring of blood glucose.    She is urged to start using her CGM to guide her treatment.  Alternatively, she can use her meter hide monitoring blood glucose 4 times a day-before meals and at bedtime until her next visit.  -In the meantime, she is advised to keep Lantus 50 units nightly, discussed and increase her Jardiance to 25 mg p.o. daily at breakfast.    - she is encouraged to call clinic for blood glucose levels less than 70 or above 200 mg /dl. She does Robyn tolerate metformin.   - Specific targets for  A1c;  LDL, HDL,  and Triglycerides were discussed with the patient.  2) Blood Pressure /Hypertension:   -Her blood pressure is controlled to target.  she is advised to continue her current medications including lisinopril 40 mg p.o. daily with breakfast .  Salt restriction is  discussed with her. 3) Lipids/Hyperlipidemia:   Review of her recent lipid panel showed worsening LDL at 101.  She is advised to continue atorvastatin 20 mg p.o. daily at bedtime.  Whole food plant-based diet will also help with dyslipidemia.   Side effects and precautions discussed with her.  4)  Weight/Diet:  Body mass index is 33.16 kg/m.  -   clearly complicating her diabetes care.   she is  a candidate for weight  loss. I discussed with her the fact that loss of 5 - 10% of her  current body weight will have the most impact on her diabetes management.  The above detailed  ACLM recommendations for nutrition, exercise, sleep, social life, avoidance of risky substances, the need for restorative sleep   information will also detailed on discharge instructions.  5) Chronic Care/Health Maintenance:  -she  2 on ACEI/ARB and Statin medications and  is encouraged to initiate and continue  to follow up with Ophthalmology, Dentist,  Podiatrist at least yearly or according to recommendations, and advised to   stay away from smoking. I have recommended yearly flu vaccine and pneumonia vaccine at least every 5 years; moderate intensity exercise for up to 150 minutes weekly; and  sleep for 7- 9 hours a day.  - she is  advised to maintain close follow up with Robyn Robert, MD for primary care needs, as well as her other providers for optimal and coordinated care.   I spent  41  minutes in the care of the patient today including review of labs from CMP, Lipids, Thyroid Function, Hematology (current and previous including abstractions from other facilities); face-to-face time discussing  her blood glucose readings/logs, discussing hypoglycemia and hyperglycemia episodes and symptoms, medications doses, her options of short and long term treatment based on the latest standards of care / guidelines;  discussion about incorporating lifestyle medicine;  and documenting the encounter. Risk reduction counseling performed per USPSTF guidelines to reduce  obesity and cardiovascular risk factors.     Please refer to Patient Instructions for Blood Glucose Monitoring and Insulin/Medications Dosing Guide"  in media tab for additional information. Please  also refer to " Patient Self Inventory" in the Media  tab for reviewed elements of pertinent patient history.  Lajean Saver participated in the discussions, expressed understanding, and voiced agreement with the above plans.  All questions were answered to her satisfaction. she is encouraged to contact clinic should she have any questions or concerns prior to her return visit.     Follow up plan: - Return in about 5 weeks (around 07/06/2023) for F/U with Meter/CGM Megan Salon Only - no Labs.  Marquis Lunch, MD [facility] [address] [place], [place] [address] Phone: [phone]  Fax: [phone]     06/01/2023, 6:26 PM  This note was partially dictated with voice recognition software. Similar sounding words can be transcribed inadequately or may Robyn  be corrected upon review.

## 2023-06-06 ENCOUNTER — Ambulatory Visit: Payer: Medicare PPO

## 2023-06-06 VITALS — BP 140/68 | Ht 64.0 in | Wt 193.0 lb

## 2023-06-06 DIAGNOSIS — E1159 Type 2 diabetes mellitus with other circulatory complications: Secondary | ICD-10-CM

## 2023-06-06 DIAGNOSIS — Z794 Long term (current) use of insulin: Secondary | ICD-10-CM | POA: Diagnosis not present

## 2023-06-06 NOTE — Patient Instructions (Signed)
Pt will contact office with any questions/concerns.

## 2023-06-06 NOTE — Progress Notes (Signed)
Mrs. Galen brings in her new Freestyle libre 2 today needing instructions on application/usage. She has a receiver and sensors. Went over all instructions for this device and applied the sensor to pts L arm. Pt voices understanding.  She was notified to call if she has any questions/concerns about her new [place] system.

## 2023-07-06 ENCOUNTER — Encounter: Payer: Self-pay | Admitting: "Endocrinology

## 2023-07-06 ENCOUNTER — Ambulatory Visit (INDEPENDENT_AMBULATORY_CARE_PROVIDER_SITE_OTHER): Payer: Medicare PPO | Admitting: "Endocrinology

## 2023-07-06 VITALS — BP 120/78 | HR 68 | Ht 64.0 in | Wt 195.4 lb

## 2023-07-06 DIAGNOSIS — I10 Essential (primary) hypertension: Secondary | ICD-10-CM | POA: Diagnosis not present

## 2023-07-06 DIAGNOSIS — E1159 Type 2 diabetes mellitus with other circulatory complications: Secondary | ICD-10-CM

## 2023-07-06 DIAGNOSIS — E782 Mixed hyperlipidemia: Secondary | ICD-10-CM | POA: Diagnosis not present

## 2023-07-06 DIAGNOSIS — E559 Vitamin D deficiency, unspecified: Secondary | ICD-10-CM

## 2023-07-06 DIAGNOSIS — Z794 Long term (current) use of insulin: Secondary | ICD-10-CM

## 2023-07-06 DIAGNOSIS — E66811 Obesity, class 1: Secondary | ICD-10-CM | POA: Diagnosis not present

## 2023-07-06 DIAGNOSIS — E6609 Other obesity due to excess calories: Secondary | ICD-10-CM

## 2023-07-06 DIAGNOSIS — Z6833 Body mass index (BMI) 33.0-33.9, adult: Secondary | ICD-10-CM

## 2023-07-06 DIAGNOSIS — Z91199 Patient's noncompliance with other medical treatment and regimen due to unspecified reason: Secondary | ICD-10-CM

## 2023-07-06 MED ORDER — LANTUS SOLOSTAR 100 UNIT/ML ~~LOC~~ SOPN: 40.0000 [IU] | PEN_INJECTOR | Freq: Every day | SUBCUTANEOUS | 1 refills | Status: DC

## 2023-07-06 MED ORDER — FREESTYLE LIBRE 2 SENSOR MISC: 1.0000 | 3 refills | Status: DC

## 2023-07-06 NOTE — Progress Notes (Signed)
07/06/2023, 11:43 AM  Endocrinology follow-up note   Subjective:    Patient ID: Robyn Crawford, female    DOB: [birth year].  Robyn Crawford is being seen in follow-up after she was sitting consultation for management of currently uncontrolled symptomatic diabetes requested by  [name], MD.   Past Medical History:  Diagnosis Date   Diabetes mellitus, type II (HCC)    Heart attack (HCC)    Hyperlipidemia    Hypertension     Past Surgical History:  Procedure Laterality Date   ABDOMINAL HYSTERECTOMY     CATARACT EXTRACTION Bilateral 12/2022   CORONARY ANGIOPLASTY WITH STENT PLACEMENT      Social History   Socioeconomic History   Marital status: Widowed    Spouse name: Not on file   Number of children: Not on file   Years of education: Not on file   Highest education level: Not on file  Occupational History   Not on file  Tobacco Use   Smoking status: Former   Smokeless tobacco: Never  Vaping Use   Vaping status: Never Used  Substance and Sexual Activity   Alcohol use: No    Alcohol/week: 0.0 standard drinks of alcohol   Drug use: No   Sexual activity: Not Currently    Birth control/protection: Abstinence  Other Topics Concern   Not on file  Social History Narrative   Not on file   Social Determinants of Health   Financial Resource Strain: Not on file  Food Insecurity: Not on file  Transportation Needs: Not on file  Physical Activity: Not on file  Stress: Not on file  Social Connections: Not on file    Family History  Problem Relation Age of Onset   Hypertension Mother    Thyroid disease Mother    Diabetes Father    Diabetes Sister    Diabetes Brother     Outpatient Encounter Medications as of 07/06/2023  Medication Sig   Continuous Glucose Sensor (FREESTYLE LIBRE 2 SENSOR) MISC 1 Piece by Does not apply route every 14 (fourteen) days.   amLODipine  (NORVASC) 10 MG tablet TAKE 1 TABLET BY MOUTH  DAILY   aspirin EC 81 MG tablet Take 81 mg by mouth daily. Swallow whole.   atorvastatin (LIPITOR) 20 MG tablet TAKE 1 TABLET BY MOUTH  DAILY   azelastine (ASTELIN) 0.1 % nasal spray Place into both nostrils.   Cholecalciferol (VITAMIN D3) 125 MCG (5000 UT) capsule Take 1 capsule (5,000 Units total) by mouth daily.   clopidogrel (PLAVIX) 75 MG tablet Take 75 mg by mouth daily.   empagliflozin (JARDIANCE) 10 MG TABS tablet Take 1 tablet (10 mg total) by mouth daily before breakfast.   fluticasone (FLONASE) 50 MCG/ACT nasal spray Place into both nostrils.   hydrochlorothiazide (HYDRODIURIL) 12.5 MG tablet TAKE 1 TABLET BY MOUTH  DAILY   insulin glargine (LANTUS SOLOSTAR) 100 UNIT/ML Solostar Pen Inject 40 Units into the skin at bedtime.   Insulin Pen Needle (ULTICARE SHORT PEN NEEDLES) 31G X 8 MM MISC USE AS DIRECTED   lisinopril (PRINIVIL,ZESTRIL) 40 MG tablet TAKE 1 TABLET  BY MOUTH  DAILY   nitroGLYCERIN (NITROSTAT) 0.4 MG SL tablet Place under the tongue.   pregabalin (LYRICA) 50 MG capsule Take 50 mg by mouth 2 (two) times daily.   [DISCONTINUED] Continuous Blood Gluc Receiver (DEXCOM G6 RECEIVER) DEVI 1 Piece by Does not apply route as needed.   [DISCONTINUED] Continuous Blood Gluc Sensor (DEXCOM G6 SENSOR) MISC 4 Pieces by Does not apply route once a week.   [DISCONTINUED] Continuous Blood Gluc Transmit (DEXCOM G6 TRANSMITTER) MISC 1 Piece by Does not apply route as directed.   [DISCONTINUED] insulin glargine (LANTUS SOLOSTAR) 100 UNIT/ML Solostar Pen Inject 50 Units into the skin at bedtime.   No facility-administered encounter medications on file as of 07/06/2023.    ALLERGIES: Allergies  Allergen Reactions   Codeine Other (See Comments)    VACCINATION STATUS:  There is no immunization history on file for this patient.  Diabetes She presents for her follow-up diabetic visit. She has type 2 diabetes mellitus. Onset time: She was  diagnosed at approximate age of 51 years. Her disease course has been improving. There are no hypoglycemic associated symptoms. Pertinent negatives for hypoglycemia include no confusion, headaches, pallor or seizures. Associated symptoms include blurred vision and fatigue. Pertinent negatives for diabetes include no chest pain, no polydipsia, no polyphagia and no polyuria. There are no hypoglycemic complications. Symptoms are improving. Diabetic complications include heart disease, nephropathy and peripheral neuropathy. Risk factors for coronary artery disease include diabetes mellitus, dyslipidemia, family history, obesity, hypertension, sedentary lifestyle, post-menopausal and tobacco exposure. Current diabetic treatment includes insulin injections. She is compliant with treatment none of the time. Her weight is fluctuating minimally. She is following a generally unhealthy diet. When asked about meal planning, she reported none. She never participates in exercise. Her home blood glucose trend is decreasing steadily. Her breakfast blood glucose range is generally 140-180 mg/dl. Her lunch blood glucose range is generally 140-180 mg/dl. Her dinner blood glucose range is generally 140-180 mg/dl. Her bedtime blood glucose range is generally 140-180 mg/dl. Her overall blood glucose range is 140-180 mg/dl. (She has obtained a CGM which she is utilizing and likes very much.   Her AGP report shows 63% time in range, 32% level 1 hyperglycemia.  She did have 4% Level One hypoglycemia.  Her average blood glucose is 156 mg per DL over the last 14 days and sharp contrast to her point-of-care A1c of 10.6% during her last visit.  ) An ACE inhibitor/angiotensin II receptor blocker is being taken. Eye exam is current.  Hyperlipidemia This is a chronic problem. The current episode started more than 1 year ago. The problem is controlled. Exacerbating diseases include diabetes and obesity. Pertinent negatives include no chest  pain, myalgias or shortness of breath. Current antihyperlipidemic treatment includes statins. Risk factors for coronary artery disease include dyslipidemia, diabetes mellitus, hypertension, obesity, post-menopausal and a sedentary lifestyle.  Hypertension Associated symptoms include blurred vision. Pertinent negatives include no chest pain, headaches, palpitations or shortness of breath. Risk factors for coronary artery disease include dyslipidemia, diabetes mellitus, family history, obesity, sedentary lifestyle and smoking/tobacco exposure. Past treatments include ACE inhibitors. Hypertensive end-organ damage includes kidney disease and CAD/MI.     Review of Systems  Constitutional:  Positive for fatigue. Negative for chills, fever and unexpected weight change.  HENT:  Negative for trouble swallowing and voice change.   Eyes:  Positive for blurred vision. Negative for visual disturbance.  Respiratory:  Negative for cough, shortness of breath and wheezing.   Cardiovascular:  Negative for chest pain, palpitations and leg swelling.  Gastrointestinal:  Negative for diarrhea, nausea and vomiting.  Endocrine: Negative for cold intolerance, heat intolerance, polydipsia, polyphagia and polyuria.  Musculoskeletal:  Negative for arthralgias and myalgias.  Skin:  Negative for color change, pallor, rash and wound.  Neurological:  Negative for seizures and headaches.  Psychiatric/Behavioral:  Negative for confusion and suicidal ideas.     Objective:       07/06/2023   10:16 AM 06/06/2023    9:48 AM 06/01/2023   11:22 AM  Vitals with BMI  Height 5\' 4"  5\' 4"  5\' 4"   Weight 195 lbs 6 oz 193 lbs 193 lbs 3 oz  BMI 33.52 33.11 33.15  Systolic 120 140 098  Diastolic 78 68 62  Pulse 68  72    BP 120/78   Pulse 68   Ht 5\' 4"  (1.626 m)   Wt 195 lb 6.4 oz (88.6 kg)   BMI 33.54 kg/m   Wt Readings from Last 3 Encounters:  07/06/23 195 lb 6.4 oz (88.6 kg)  06/06/23 193 lb (87.5 kg)  06/01/23 193 lb  3.2 oz (87.6 kg)     CMP     Component Value Date/Time   NA 144 12/14/2022 0929   K 4.1 12/14/2022 0929   CL 99 12/14/2022 0929   CO2 28 12/14/2022 0929   GLUCOSE 103 (H) 12/14/2022 0929   GLUCOSE 98 04/19/2021 1807   BUN 13 12/14/2022 0929   CREATININE 1.15 (H) 12/14/2022 0929   CREATININE 1.15 (H) 04/28/2016 0929   CALCIUM 10.3 12/14/2022 0929   PROT 7.1 12/14/2022 0929   ALBUMIN 4.6 12/14/2022 0929   AST 26 12/14/2022 0929   ALT 20 12/14/2022 0929   ALKPHOS 135 (H) 12/14/2022 0929   BILITOT 0.7 12/14/2022 0929   GFRNONAA 46 (L) 04/19/2021 1807     Diabetic Labs (most recent): Lab Results  Component Value Date   HGBA1C 10.6 (A) 06/01/2023   HGBA1C 10.7 (A) 01/05/2023   HGBA1C 10.8 (A) 07/12/2022     Lipid Panel ( most recent) Lipid Panel     Component Value Date/Time   CHOL 167 12/14/2022 0929   TRIG 63 12/14/2022 0929   HDL 54 12/14/2022 0929   CHOLHDL 3.1 12/14/2022 0929   CHOLHDL 3.4 01/26/2016 0858   VLDL 15 01/26/2016 0858   LDLCALC 101 (H) 12/14/2022 0929   LABVLDL 12 12/14/2022 0929      Lab Results  Component Value Date   TSH 0.804 12/14/2022   TSH 0.71 01/26/2016   FREET4 1.49 12/14/2022   FREET4 1.2 01/26/2016      Assessment & Plan:   1. DM type 2 causing vascular disease (HCC)   - Wanna W Carbonell has currently uncontrolled symptomatic type 2 DM since  77 years of age.  She has obtained a CGM which she is utilizing and likes very much.   Her AGP report shows 63% time in range, 32% level 1 hyperglycemia.  She did have 4% Level One hypoglycemia.  Her average blood glucose is 156 mg per DL over the last 14 days and sharp contrast to her point-of-care A1c of 10.6% during her last visit.   Recent labs reviewed. - I had a long discussion with her about the possible risk factors and  the pathology behind its diabetes and its complications. -her diabetes is complicated by coronary artery disease, CKD, obesity, sedentary life,  noncompliance/nonadherence and she remains at a high risk for more acute and  chronic complications which include CAD, CVA, CKD, retinopathy, and neuropathy. These are all discussed in detail with her. - I discussed all available options of managing her diabetes including de-escalation of medications. I have counseled her on Food as Medicine by adopting a Whole Food , Plant Predominant  ( WFPP) nutrition as recommended by [employer] of Lifestyle Medicine. Patient is encouraged to switch to  unprocessed or minimally processed  complex starch, adequate protein intake (mainly plant source), minimal liquid fat, plenty of fruits, and vegetables.  -  she is advised to stick to a routine mealtimes to eat 3 complete meals a day and snack only when necessary ( to snack only to correct hypoglycemia BG <70 day time or <100 at night).   -She did not engage optimally with lifestyle medicine.  However she wishes to have the plan.  - she acknowledges that there is a room for improvement in her food and drink choices. - Suggestion is made for her to avoid simple carbohydrates  from her diet including Cakes, Sweet Desserts, Ice Cream, Soda (diet and regular), Sweet Tea, Candies, Chips, Cookies, Store Bought Juices, Alcohol , Artificial Sweeteners,  Coffee Creamer, and "Sugar-free" Products, Lemonade. This will help patient to have more stable blood glucose profile and potentially avoid unintended weight gain.  The following Lifestyle Medicine recommendations according to American College of Lifestyle Medicine  [facility]) were discussed and and offered to patient and she  agrees to start the journey:  A. Whole Foods, Plant-Based Nutrition comprising of fruits and vegetables, plant-based proteins, whole-grain carbohydrates was discussed in detail with the patient.   A list for source of those nutrients were also provided to the patient.  Patient will use only water or unsweetened tea for hydration. B.  The need to stay  away from risky substances including alcohol, smoking; obtaining 7 to 9 hours of restorative sleep, at least 150 minutes of moderate intensity exercise weekly, the importance of healthy social connections,  and stress management techniques were discussed. C.  A full color page of  Calorie density of various food groups per pound showing examples of each food groups was provided to the patient.   - I have approached her with the following individualized plan to manage  her diabetes and patient agrees:   -Ms. Mazer is benefiting from a CGM and has shown reasonable engagement since last visit.   -Based on her presentation today, she will not need prandial insulin for now.  She is advised to lower her Lantus to 40 units nightly.  She is advised to utilize her CGM continuously.   -She is also benefiting from her SGLT2 inhibitors, advised to continue Jardiance to 25 mg p.o. daily at breakfast.    - she is encouraged to call clinic for blood glucose levels less than 70 or above 200 mg /dl. She does not tolerate metformin.   - Specific targets for  A1c;  LDL, HDL,  and Triglycerides were discussed with the patient.  2) Blood Pressure /Hypertension:   -Her blood pressure is controlled to target.  she is advised to continue her current medications including lisinopril 40 mg p.o. daily with breakfast .  Salt restriction is  discussed with her.   3) Lipids/Hyperlipidemia:   Review of her recent lipid panel showed worsening LDL at 101.  She is advised to continue atorvastatin 20 mg p.o. daily at bedtime.     Whole food plant-based diet will also help with dyslipidemia.   Side effects and  precautions discussed with her.  4)  Weight/Diet:  Body mass index is 33.54 kg/m.  -   clearly complicating her diabetes care.   she is  a candidate for weight loss. I discussed with her the fact that loss of 5 - 10% of her  current body weight will have the most impact on her diabetes management.  The above detailed   ACLM recommendations for nutrition, exercise, sleep, social life, avoidance of risky substances, the need for restorative sleep   information will also detailed on discharge instructions.  5) Chronic Care/Health Maintenance:  -she  2 on ACEI/ARB and Statin medications and  is encouraged to initiate and continue to follow up with Ophthalmology, Dentist,  Podiatrist at least yearly or according to recommendations, and advised to   stay away from smoking. I have recommended yearly flu vaccine and pneumonia vaccine at least every 5 years; moderate intensity exercise for up to 150 minutes weekly; and  sleep for 7- 9 hours a day.  - she is  advised to maintain close follow up with [name], MD for primary care needs, as well as her other providers for optimal and coordinated care.   I spent  26  minutes in the care of the patient today including review of labs from CMP, Lipids, Thyroid Function, Hematology (current and previous including abstractions from other facilities); face-to-face time discussing  her blood glucose readings/logs, discussing hypoglycemia and hyperglycemia episodes and symptoms, medications doses, her options of short and long term treatment based on the latest standards of care / guidelines;  discussion about incorporating lifestyle medicine;  and documenting the encounter. Risk reduction counseling performed per USPSTF guidelines to reduce  obesity and cardiovascular risk factors.     Please refer to Patient Instructions for Blood Glucose Monitoring and Insulin/Medications Dosing Guide"  in media tab for additional information. Please  also refer to " Patient Self Inventory" in the Media  tab for reviewed elements of pertinent patient history.  Lajean Saver participated in the discussions, expressed understanding, and voiced agreement with the above plans.  All questions were answered to her satisfaction. she is encouraged to contact clinic should she have any questions or  concerns prior to her return visit.     Follow up plan: - Return in about 4 months (around 11/06/2023) for F/U with Pre-visit Labs, Meter/CGM/Logs, A1c here.  Marquis Lunch, MD [facility] [address] [place], [place] [address] Phone: [phone]  Fax: [phone]    07/06/2023, 11:43 AM  This note was partially dictated with voice recognition software. Similar sounding words can be transcribed inadequately or may not  be corrected upon review.

## 2023-07-06 NOTE — Patient Instructions (Signed)

## 2023-08-07 ENCOUNTER — Other Ambulatory Visit: Payer: Self-pay | Admitting: "Endocrinology

## 2023-09-28 ENCOUNTER — Other Ambulatory Visit: Payer: Self-pay | Admitting: "Endocrinology

## 2023-10-02 ENCOUNTER — Other Ambulatory Visit: Payer: Self-pay | Admitting: "Endocrinology

## 2023-11-01 ENCOUNTER — Other Ambulatory Visit: Payer: Self-pay | Admitting: "Endocrinology

## 2023-11-07 ENCOUNTER — Ambulatory Visit: Payer: Medicare PPO | Admitting: "Endocrinology

## 2023-12-08 LAB — COMPREHENSIVE METABOLIC PANEL
ALT: 19 IU/L (ref 0–32)
AST: 23 IU/L (ref 0–40)
Albumin: 4.4 g/dL (ref 3.8–4.8)
Alkaline Phosphatase: 136 IU/L — ABNORMAL HIGH (ref 44–121)
BUN/Creatinine Ratio: 14 (ref 12–28)
BUN: 14 mg/dL (ref 8–27)
Bilirubin Total: 0.7 mg/dL (ref 0.0–1.2)
CO2: 27 mmol/L (ref 20–29)
Calcium: 10.3 mg/dL (ref 8.7–10.3)
Chloride: 98 mmol/L (ref 96–106)
Creatinine, Ser: 0.97 mg/dL (ref 0.57–1.00)
Globulin, Total: 2.4 g/dL (ref 1.5–4.5)
Glucose: 174 mg/dL — ABNORMAL HIGH (ref 70–99)
Potassium: 4.4 mmol/L (ref 3.5–5.2)
Sodium: 141 mmol/L (ref 134–144)
Total Protein: 6.8 g/dL (ref 6.0–8.5)
eGFR: 61 mL/min/{1.73_m2} (ref >59)

## 2023-12-08 LAB — LIPID PANEL
Chol/HDL Ratio: 3.4 ratio (ref 0.0–4.4)
Cholesterol, Total: 165 mg/dL (ref 100–199)
HDL: 48 mg/dL (ref >39)
LDL Chol Calc (NIH): 104 mg/dL — ABNORMAL HIGH (ref 0–99)
Triglycerides: 65 mg/dL (ref 0–149)
VLDL Cholesterol Cal: 13 mg/dL (ref 5–40)

## 2023-12-08 LAB — VITAMIN D 25 HYDROXY (VIT D DEFICIENCY, FRACTURES): Vit D, 25-Hydroxy: 59.5 ng/mL (ref 30.0–100.0)

## 2023-12-14 ENCOUNTER — Encounter: Payer: Self-pay | Admitting: "Endocrinology

## 2023-12-14 ENCOUNTER — Ambulatory Visit (INDEPENDENT_AMBULATORY_CARE_PROVIDER_SITE_OTHER): Admitting: "Endocrinology

## 2023-12-14 VITALS — BP 150/76 | HR 76 | Ht 64.0 in | Wt 187.8 lb

## 2023-12-14 DIAGNOSIS — E66811 Obesity, class 1: Secondary | ICD-10-CM

## 2023-12-14 DIAGNOSIS — E1159 Type 2 diabetes mellitus with other circulatory complications: Secondary | ICD-10-CM | POA: Diagnosis not present

## 2023-12-14 DIAGNOSIS — Z6833 Body mass index (BMI) 33.0-33.9, adult: Secondary | ICD-10-CM

## 2023-12-14 DIAGNOSIS — I10 Essential (primary) hypertension: Secondary | ICD-10-CM

## 2023-12-14 DIAGNOSIS — Z91199 Patient's noncompliance with other medical treatment and regimen due to unspecified reason: Secondary | ICD-10-CM

## 2023-12-14 DIAGNOSIS — E782 Mixed hyperlipidemia: Secondary | ICD-10-CM | POA: Diagnosis not present

## 2023-12-14 DIAGNOSIS — E6609 Other obesity due to excess calories: Secondary | ICD-10-CM

## 2023-12-14 DIAGNOSIS — Z794 Long term (current) use of insulin: Secondary | ICD-10-CM

## 2023-12-14 LAB — POCT GLYCOSYLATED HEMOGLOBIN (HGB A1C): HbA1c, POC (controlled diabetic range): 10.6 % — AB (ref 0.0–7.0)

## 2023-12-14 MED ORDER — ACCU-CHEK GUIDE TEST VI STRP: ORAL_STRIP | 3 refills | Status: AC

## 2023-12-14 MED ORDER — LANTUS SOLOSTAR 100 UNIT/ML ~~LOC~~ SOPN: 50.0000 [IU] | PEN_INJECTOR | Freq: Every day | SUBCUTANEOUS | 3 refills | Status: DC

## 2023-12-14 MED ORDER — TIRZEPATIDE 2.5 MG/0.5ML ~~LOC~~ SOAJ: 2.5000 mg | SUBCUTANEOUS | 0 refills | Status: DC

## 2023-12-14 MED ORDER — FREESTYLE LIBRE 3 PLUS SENSOR MISC: 2 refills | Status: DC

## 2023-12-14 MED ORDER — FREESTYLE LIBRE 3 READER DEVI: 1.0000 | Freq: Once | 0 refills | Status: AC | PRN

## 2023-12-14 MED ORDER — ACCU-CHEK GUIDE ME W/DEVICE KIT: 1.0000 | PACK | 0 refills | Status: AC

## 2023-12-14 NOTE — Progress Notes (Signed)
 12/14/2023, 4:40 PM  Endocrinology follow-up note   Subjective:    Patient ID: Robyn Crawford, female    DOB: Dec 08, 1945.  Robyn Crawford is being seen in follow-up after she was sitting consultation for management of currently uncontrolled symptomatic diabetes requested by  [name], MD.   Past Medical History:  Diagnosis Date   Diabetes mellitus, type II (HCC)    Heart attack (HCC)    Hyperlipidemia    Hypertension     Past Surgical History:  Procedure Laterality Date   ABDOMINAL HYSTERECTOMY     CATARACT EXTRACTION Bilateral 12/2022   CORONARY ANGIOPLASTY WITH STENT PLACEMENT      Social History   Socioeconomic History   Marital status: Widowed    Spouse name: Not on file   Number of children: Not on file   Years of education: Not on file   Highest education level: Not on file  Occupational History   Not on file  Tobacco Use   Smoking status: Former   Smokeless tobacco: Never  Vaping Use   Vaping status: Never Used  Substance and Sexual Activity   Alcohol use: No    Alcohol/week: 0.0 standard drinks of alcohol   Drug use: No   Sexual activity: Not Currently    Birth control/protection: Abstinence  Other Topics Concern   Not on file  Social History Narrative   Not on file   Social Drivers of Health   Financial Resource Strain: Not on file  Food Insecurity: Not on file  Transportation Needs: Not on file  Physical Activity: Not on file  Stress: Not on file  Social Connections: Not on file    Family History  Problem Relation Age of Onset   Hypertension Mother    Thyroid disease Mother    Diabetes Father    Diabetes Sister    Diabetes Brother     Outpatient Encounter Medications as of 12/14/2023  Medication Sig   Blood Glucose Monitoring Suppl (ACCU-CHEK GUIDE ME) w/Device KIT 1 Piece by Does not apply route as directed.   Continuous Glucose Receiver  (FREESTYLE LIBRE 3 READER) DEVI 1 Piece by Does not apply route once as needed for up to 1 dose.   Continuous Glucose Sensor (FREESTYLE LIBRE 3 PLUS SENSOR) MISC Change sensor every 15 days.   glucose blood (ACCU-CHEK GUIDE TEST) test strip Use to monitor glucose 2 times daily as instructed   tirzepatide (MOUNJARO) 2.5 MG/0.5ML Pen Inject 2.5 mg into the skin once a week.   amLODipine (NORVASC) 10 MG tablet TAKE 1 TABLET BY MOUTH  DAILY   aspirin EC 81 MG tablet Take 81 mg by mouth daily. Swallow whole.   atorvastatin (LIPITOR) 20 MG tablet TAKE 1 TABLET BY MOUTH  DAILY   azelastine (ASTELIN) 0.1 % nasal spray Place into both nostrils.   Cholecalciferol (VITAMIN D3) 125 MCG (5000 UT) CAPS TAKE ONE CAPSULE BY MOUTH ONCE DAILY   clopidogrel (PLAVIX) 75 MG tablet Take 75 mg by mouth daily.   fluticasone (FLONASE) 50 MCG/ACT nasal spray Place into both nostrils.   hydrochlorothiazide (HYDRODIURIL) 12.5 MG  tablet TAKE 1 TABLET BY MOUTH  DAILY   insulin glargine (LANTUS SOLOSTAR) 100 UNIT/ML Solostar Pen Inject 50 Units into the skin at bedtime.   Insulin Pen Needle (ULTICARE SHORT PEN NEEDLES) 31G X 8 MM MISC USE AS DIRECTED   lisinopril (PRINIVIL,ZESTRIL) 40 MG tablet TAKE 1 TABLET BY MOUTH  DAILY   nitroGLYCERIN (NITROSTAT) 0.4 MG SL tablet Place under the tongue.   pregabalin (LYRICA) 50 MG capsule Take 50 mg by mouth 2 (two) times daily.   [DISCONTINUED] Continuous Glucose Sensor (FREESTYLE LIBRE 2 SENSOR) MISC APPLY EVERY 14 DAYS   [DISCONTINUED] insulin glargine (LANTUS SOLOSTAR) 100 UNIT/ML Solostar Pen Inject 40 Units into the skin at bedtime.   [DISCONTINUED] JARDIANCE 10 MG TABS tablet TAKE 1 TABLET EVERY DAY BEFORE BREAKFAST (Patient not taking: Reported on 12/14/2023)   No facility-administered encounter medications on file as of 12/14/2023.    ALLERGIES: Allergies  Allergen Reactions   Codeine Other (See Comments)    VACCINATION STATUS: Immunization History  Administered Date(s)  Administered   Moderna Covid-19 Fall Seasonal Vaccine 27yrs & older 10/05/2023    Diabetes She presents for her follow-up diabetic visit. She has type 2 diabetes mellitus. Onset time: She was diagnosed at approximate age of 51 years. Her disease course has been worsening. There are no hypoglycemic associated symptoms. Pertinent negatives for hypoglycemia include no confusion, headaches, pallor or seizures. Associated symptoms include fatigue, polydipsia and polyuria. Pertinent negatives for diabetes include no blurred vision, no chest pain and no polyphagia. There are no hypoglycemic complications. Symptoms are worsening. Diabetic complications include heart disease, nephropathy and peripheral neuropathy. Risk factors for coronary artery disease include diabetes mellitus, dyslipidemia, family history, obesity, hypertension, sedentary lifestyle, post-menopausal and tobacco exposure. Current diabetic treatment includes insulin injections. She is compliant with treatment none of the time. Her weight is fluctuating minimally. She is following a generally unhealthy diet. When asked about meal planning, she reported none. She never participates in exercise. Her home blood glucose trend is increasing steadily. Her breakfast blood glucose range is generally 180-200 mg/dl. Her lunch blood glucose range is generally 180-200 mg/dl. Her dinner blood glucose range is generally 180-200 mg/dl. Her bedtime blood glucose range is generally 180-200 mg/dl. Her overall blood glucose range is 180-200 mg/dl. (She has not utilized her CGM adequately.  She is requesting for libre 3 device.  Her AGP report shows 40% time range, 38% Libre 1 hyperglycemia, 19% level 2 hyperglycemia.  Her average blood glucose is 197 mg per DL with point-of-care Z6X of 10.6% unchanged from her previous visit.  She did not document or report any hypoglycemia.    ) An ACE inhibitor/angiotensin II receptor blocker is being taken. Eye exam is current.   Hyperlipidemia This is a chronic problem. The current episode started more than 1 year ago. The problem is controlled. Exacerbating diseases include diabetes and obesity. Pertinent negatives include no chest pain, myalgias or shortness of breath. Current antihyperlipidemic treatment includes statins. Risk factors for coronary artery disease include dyslipidemia, diabetes mellitus, hypertension, obesity, post-menopausal and a sedentary lifestyle.  Hypertension The current episode started more than 1 year ago. The problem is uncontrolled. Pertinent negatives include no blurred vision, chest pain, headaches, palpitations or shortness of breath. Risk factors for coronary artery disease include dyslipidemia, diabetes mellitus, family history, obesity, sedentary lifestyle and smoking/tobacco exposure. Past treatments include ACE inhibitors. Hypertensive end-organ damage includes kidney disease and CAD/MI.     Review of Systems  Constitutional:  Positive for fatigue. Negative  for chills, fever and unexpected weight change.  HENT:  Negative for trouble swallowing and voice change.   Eyes:  Negative for blurred vision and visual disturbance.  Respiratory:  Negative for cough, shortness of breath and wheezing.   Cardiovascular:  Negative for chest pain, palpitations and leg swelling.  Gastrointestinal:  Negative for diarrhea, nausea and vomiting.  Endocrine: Positive for polydipsia and polyuria. Negative for cold intolerance, heat intolerance and polyphagia.  Musculoskeletal:  Negative for arthralgias and myalgias.  Skin:  Negative for color change, pallor, rash and wound.  Neurological:  Negative for seizures and headaches.  Psychiatric/Behavioral:  Negative for confusion and suicidal ideas.     Objective:       12/14/2023   11:01 AM 07/06/2023   10:16 AM 06/06/2023    9:48 AM  Vitals with BMI  Height 5\' 4"  5\' 4"  5\' 4"   Weight 187 lbs 13 oz 195 lbs 6 oz 193 lbs  BMI 32.22 33.52 33.11  Systolic  150 120 140  Diastolic 76 78 68  Pulse 76 68     BP (!) 150/76   Pulse 76   Ht 5\' 4"  (1.626 m)   Wt 187 lb 12.8 oz (85.2 kg)   BMI 32.24 kg/m   Wt Readings from Last 3 Encounters:  12/14/23 187 lb 12.8 oz (85.2 kg)  07/06/23 195 lb 6.4 oz (88.6 kg)  06/06/23 193 lb (87.5 kg)     CMP     Component Value Date/Time   NA 141 12/07/2023 1012   K 4.4 12/07/2023 1012   CL 98 12/07/2023 1012   CO2 27 12/07/2023 1012   GLUCOSE 174 (H) 12/07/2023 1012   GLUCOSE 98 04/19/2021 1807   BUN 14 12/07/2023 1012   CREATININE 0.97 12/07/2023 1012   CREATININE 1.15 (H) 04/28/2016 0929   CALCIUM 10.3 12/07/2023 1012   PROT 6.8 12/07/2023 1012   ALBUMIN 4.4 12/07/2023 1012   AST 23 12/07/2023 1012   ALT 19 12/07/2023 1012   ALKPHOS 136 (H) 12/07/2023 1012   BILITOT 0.7 12/07/2023 1012   GFRNONAA 46 (L) 04/19/2021 1807     Diabetic Labs (most recent): Lab Results  Component Value Date   HGBA1C 10.6 (A) 12/14/2023   HGBA1C 10.6 (A) 06/01/2023   HGBA1C 10.7 (A) 01/05/2023     Lipid Panel ( most recent) Lipid Panel     Component Value Date/Time   CHOL 165 12/07/2023 1012   TRIG 65 12/07/2023 1012   HDL 48 12/07/2023 1012   CHOLHDL 3.4 12/07/2023 1012   CHOLHDL 3.4 01/26/2016 0858   VLDL 15 01/26/2016 0858   LDLCALC 104 (H) 12/07/2023 1012   LABVLDL 13 12/07/2023 1012      Lab Results  Component Value Date   TSH 0.804 12/14/2022   TSH 0.71 01/26/2016   FREET4 1.49 12/14/2022   FREET4 1.2 01/26/2016      Assessment & Plan:   1. DM type 2 causing vascular disease (HCC)   - Robyn Crawford has currently uncontrolled symptomatic type 2 DM since  78 years of age.  She has not utilized her CGM adequately.  She is requesting for libre 3 device.  Her AGP report shows 40% time range, 38% Libre 1 hyperglycemia, 19% level 2 hyperglycemia.  Her average blood glucose is 197 mg per DL with point-of-care F6O of 10.6% unchanged from her previous visit.  She did not document or  report any hypoglycemia.     Recent labs reviewed. - I  had a long discussion with her about the possible risk factors and  the pathology behind its diabetes and its complications. -her diabetes is complicated by coronary artery disease, CKD, obesity, sedentary life, noncompliance/nonadherence and she remains at a high risk for more acute and chronic complications which include CAD, CVA, CKD, retinopathy, and neuropathy. These are all discussed in detail with her. - I discussed all available options of managing her diabetes including de-escalation of medications. I have counseled her on Food as Medicine by adopting a Whole Food , Plant Predominant  ( WFPP) nutrition as recommended by [employer] of Lifestyle Medicine. Patient is encouraged to switch to  unprocessed or minimally processed  complex starch, adequate protein intake (mainly plant source), minimal liquid fat, plenty of fruits, and vegetables.  -  she is advised to stick to a routine mealtimes to eat 3 complete meals a day and snack only when necessary ( to snack only to correct hypoglycemia BG <70 day time or <100 at night).   -She did not engage optimally with lifestyle medicine.  However she wishes to have the plan.   - she acknowledges that there is a room for improvement in her food and drink choices. - Suggestion is made for her to avoid simple carbohydrates  from her diet including Cakes, Sweet Desserts, Ice Cream, Soda (diet and regular), Sweet Tea, Candies, Chips, Cookies, Store Bought Juices, Alcohol , Artificial Sweeteners,  Coffee Creamer, and "Sugar-free" Products, Lemonade. This will help patient to have more stable blood glucose profile and potentially avoid unintended weight gain.  - I have approached her with the following individualized plan to manage  her diabetes and patient agrees:   -Robyn Crawford has once again disengaged from self-care.   -Based on her presentation today, she will need a higher dose of insulin.   I discussed and increase her Lantus to 50 units nightly, urged her to start utilizing her CGM properly.   Her insurance did not provide coverage for Jardiance, will discontinue. -This patient may benefit from GLP-1 receptor agonist.  I discussed and prescribed Mounjaro 2.5 mg subcutaneously weekly.  This medication will be advanced as she tolerates.    - she is encouraged to call clinic for blood glucose levels less than 70 or above 200 mg /dl.  If she calls for hyperglycemia, she will be considered for prandial insulin. She does not tolerate metformin.   - Specific targets for  A1c;  LDL, HDL,  and Triglycerides were discussed with the patient.  2) Blood Pressure /Hypertension:   Her blood pressure is not controlled to target.  she is advised to continue her current medications including lisinopril 40 mg p.o. daily with breakfast .  Light to moderate intensity exercise up to 30 -60 minutes daily for 3 to 4 days a week is discussed with her.      3) Lipids/Hyperlipidemia:   Review of her recent lipid panel showed worsening LDL at 104.  She is advised to continue atorvastatin 20 mg p.o. daily at bedtime.  Whole food plant-based diet will also help with dyslipidemia.   Side effects and precautions discussed with her.  4)  Weight/Diet:  Body mass index is 32.24 kg/m.  -   clearly complicating her diabetes care.   she is  a candidate for weight loss. I discussed with her the fact that loss of 5 - 10% of her  current body weight will have the most impact on her diabetes management.  The above detailed  ACLM recommendations for nutrition, exercise, sleep, social life, avoidance of risky substances, the need for restorative sleep   information will also detailed on discharge instructions.  5) Chronic Care/Health Maintenance:  -she  2 on ACEI/ARB and Statin medications and  is encouraged to initiate and continue to follow up with Ophthalmology, Dentist,  Podiatrist at least yearly or according to  recommendations, and advised to   stay away from smoking. I have recommended yearly flu vaccine and pneumonia vaccine at least every 5 years; moderate intensity exercise for up to 150 minutes weekly; and  sleep for 7- 9 hours a day.  - she is  advised to maintain close follow up with [name], MD for primary care needs, as well as her other providers for optimal and coordinated care.   I spent  41  minutes in the care of the patient today including review of labs from CMP, Lipids, Thyroid Function, Hematology (current and previous including abstractions from other facilities); face-to-face time discussing  her blood glucose readings/logs, discussing hypoglycemia and hyperglycemia episodes and symptoms, medications doses, her options of short and long term treatment based on the latest standards of care / guidelines;  discussion about incorporating lifestyle medicine;  and documenting the encounter. Risk reduction counseling performed per USPSTF guidelines to reduce  obesity and cardiovascular risk factors.     Please refer to Patient Instructions for Blood Glucose Monitoring and Insulin/Medications Dosing Guide"  in media tab for additional information. Please  also refer to " Patient Self Inventory" in the Media  tab for reviewed elements of pertinent patient history.  Robyn Crawford participated in the discussions, expressed understanding, and voiced agreement with the above plans.  All questions were answered to her satisfaction. she is encouraged to contact clinic should she have any questions or concerns prior to her return visit.      Follow up plan: - Return in about 3 months (around 03/15/2024) for Bring Meter/CGM Device/Logs- A1c in Office.  Marquis Lunch, MD [facility] [address] [place], [place] [address] Phone: [phone]  Fax: [phone]    12/14/2023, 4:40 PM  This note was partially dictated with voice  recognition software. Similar sounding words can be transcribed inadequately or may not  be corrected upon review.

## 2023-12-14 NOTE — Patient Instructions (Signed)

## 2023-12-15 ENCOUNTER — Ambulatory Visit: Payer: Medicare PPO | Admitting: "Endocrinology

## 2023-12-28 ENCOUNTER — Telehealth: Payer: Self-pay

## 2023-12-28 ENCOUNTER — Other Ambulatory Visit (HOSPITAL_COMMUNITY): Payer: Self-pay

## 2023-12-28 NOTE — Telephone Encounter (Signed)
 Received notice pt's Rx Mounjaro 2.5mg  needs prior auth.

## 2023-12-28 NOTE — Telephone Encounter (Signed)
 Pharmacy Patient Advocate Encounter   Received notification from Pt Calls Messages that prior authorization for Robyn Crawford is required/requested.   Insurance verification completed.   The patient is insured through [employer] .   Per test claim: Refill too soon. PA is not needed at this time. Medication was filled 12/15/23. Next eligible fill date is 01/05/24.

## 2023-12-29 NOTE — Telephone Encounter (Signed)
 Left a message requesting pt return call to the office.

## 2024-01-01 NOTE — Telephone Encounter (Signed)
 Left a message requesting pt to return call to the office

## 2024-01-02 NOTE — Telephone Encounter (Signed)
 Left a message on pt's cell phone requesting pt return call to the office.

## 2024-01-03 ENCOUNTER — Telehealth: Payer: Self-pay

## 2024-01-03 ENCOUNTER — Other Ambulatory Visit (HOSPITAL_COMMUNITY): Payer: Self-pay

## 2024-01-03 NOTE — Telephone Encounter (Signed)
 Pharmacy Patient Advocate Encounter   Received notification from Pt Calls Messages that prior authorization for Freestyle libre 3 plus is required/requested.   Insurance verification completed.   The patient is insured through [employer] .   Per test claim: Refill too soon. PA is not needed at this time. Medication was filled 12/14/23. Next eligible fill date is 01/06/24.

## 2024-01-03 NOTE — Telephone Encounter (Signed)
Left message requesting pt return call to the office. 

## 2024-01-03 NOTE — Telephone Encounter (Signed)
 Left a message requesting pt return call to the office.

## 2024-01-03 NOTE — Telephone Encounter (Signed)
 Received notice from [facility] pt's Rx for [facility] 3 sensors need prior authorization.

## 2024-01-23 ENCOUNTER — Other Ambulatory Visit: Payer: Self-pay

## 2024-01-23 DIAGNOSIS — E1159 Type 2 diabetes mellitus with other circulatory complications: Secondary | ICD-10-CM

## 2024-01-23 MED ORDER — FREESTYLE LIBRE 3 PLUS SENSOR MISC: 0 refills | Status: DC

## 2024-03-08 ENCOUNTER — Other Ambulatory Visit: Payer: Self-pay | Admitting: "Endocrinology

## 2024-03-18 ENCOUNTER — Ambulatory Visit: Admitting: "Endocrinology

## 2024-03-21 ENCOUNTER — Ambulatory Visit: Admitting: "Endocrinology

## 2024-04-05 ENCOUNTER — Other Ambulatory Visit: Payer: Self-pay | Admitting: "Endocrinology

## 2024-04-05 DIAGNOSIS — E1159 Type 2 diabetes mellitus with other circulatory complications: Secondary | ICD-10-CM

## 2024-04-15 ENCOUNTER — Ambulatory Visit: Admitting: "Endocrinology

## 2024-04-21 ENCOUNTER — Other Ambulatory Visit: Payer: Self-pay | Admitting: "Endocrinology

## 2024-04-29 ENCOUNTER — Other Ambulatory Visit: Payer: Self-pay | Admitting: "Endocrinology

## 2024-04-29 DIAGNOSIS — E1159 Type 2 diabetes mellitus with other circulatory complications: Secondary | ICD-10-CM

## 2024-05-03 ENCOUNTER — Ambulatory Visit: Admitting: "Endocrinology

## 2024-05-16 ENCOUNTER — Ambulatory Visit: Admitting: "Endocrinology

## 2024-05-16 ENCOUNTER — Encounter: Payer: Self-pay | Admitting: "Endocrinology

## 2024-05-16 VITALS — BP 138/66 | HR 72 | Ht 64.0 in | Wt 194.0 lb

## 2024-05-16 DIAGNOSIS — E782 Mixed hyperlipidemia: Secondary | ICD-10-CM | POA: Diagnosis not present

## 2024-05-16 DIAGNOSIS — Z6833 Body mass index (BMI) 33.0-33.9, adult: Secondary | ICD-10-CM

## 2024-05-16 DIAGNOSIS — I10 Essential (primary) hypertension: Secondary | ICD-10-CM | POA: Diagnosis not present

## 2024-05-16 DIAGNOSIS — E1159 Type 2 diabetes mellitus with other circulatory complications: Secondary | ICD-10-CM

## 2024-05-16 DIAGNOSIS — E6609 Other obesity due to excess calories: Secondary | ICD-10-CM

## 2024-05-16 DIAGNOSIS — E66811 Obesity, class 1: Secondary | ICD-10-CM

## 2024-05-16 DIAGNOSIS — Z794 Long term (current) use of insulin: Secondary | ICD-10-CM

## 2024-05-16 LAB — POCT GLYCOSYLATED HEMOGLOBIN (HGB A1C): HbA1c, POC (controlled diabetic range): 8.9 % — AB (ref 0.0–7.0)

## 2024-05-16 MED ORDER — LANTUS SOLOSTAR 100 UNIT/ML ~~LOC~~ SOPN: 60.0000 [IU] | PEN_INJECTOR | Freq: Every day | SUBCUTANEOUS | 1 refills | Status: AC

## 2024-05-16 MED ORDER — TIRZEPATIDE 5 MG/0.5ML ~~LOC~~ SOAJ
5.0000 mg | SUBCUTANEOUS | 1 refills | Status: DC
Start: 2024-05-16 — End: 2024-07-02

## 2024-05-16 MED ORDER — ATORVASTATIN CALCIUM 40 MG PO TABS: 20.0000 mg | ORAL_TABLET | Freq: Every day | ORAL | 1 refills | Status: AC

## 2024-05-16 NOTE — Progress Notes (Addendum)
 05/16/2024, 12:07 PM  Endocrinology follow-up note   Subjective:    Patient ID: Robyn Crawford, female    DOB: [birth year].  Robyn Crawford is being seen in follow-up after she was sitting consultation for management of currently uncontrolled symptomatic diabetes requested by  Pecolia Senior, MD.   Past Medical History:  Diagnosis Date   Diabetes mellitus, type II (HCC)    Heart attack (HCC)    Hyperlipidemia    Hypertension     Past Surgical History:  Procedure Laterality Date   ABDOMINAL HYSTERECTOMY     CATARACT EXTRACTION Bilateral 12/2022   CORONARY ANGIOPLASTY WITH STENT PLACEMENT      Social History   Socioeconomic History   Marital status: Widowed    Spouse name: Not on file   Number of children: Not on file   Years of education: Not on file   Highest education level: Not on file  Occupational History   Not on file  Tobacco Use   Smoking status: Former   Smokeless tobacco: Never  Vaping Use   Vaping status: Never Used  Substance and Sexual Activity   Alcohol use: No    Alcohol/week: 0.0 standard drinks of alcohol   Drug use: No   Sexual activity: Not Currently    Birth control/protection: Abstinence  Other Topics Concern   Not on file  Social History Narrative   Not on file   Social Drivers of Health   Financial Resource Strain: Not on file  Food Insecurity: Not on file  Transportation Needs: Not on file  Physical Activity: Not on file  Stress: Not on file  Social Connections: Not on file    Family History  Problem Relation Age of Onset   Hypertension Mother    Thyroid disease Mother    Diabetes Father    Diabetes Sister    Diabetes Brother     Outpatient Encounter Medications as of 05/16/2024  Medication Sig   tirzepatide  (MOUNJARO ) 5 MG/0.5ML Pen Inject 5 mg into the skin once a week.   amLODipine  (NORVASC ) 10 MG tablet TAKE 1 TABLET BY MOUTH   DAILY   aspirin EC 81 MG tablet Take 81 mg by mouth daily. Swallow whole.   atorvastatin  (LIPITOR) 20 MG tablet TAKE 1 TABLET BY MOUTH  DAILY   azelastine (ASTELIN) 0.1 % nasal spray Place into both nostrils.   Blood Glucose Monitoring Suppl (ACCU-CHEK GUIDE ME) w/Device KIT 1 Piece by Does not apply route as directed.   Cholecalciferol (VITAMIN D3) 125 MCG (5000 UT) CAPS TAKE ONE CAPSULE BY MOUTH ONCE DAILY   clopidogrel  (PLAVIX ) 75 MG tablet Take 75 mg by mouth daily.   Continuous Glucose Receiver (FREESTYLE LIBRE 3 READER) DEVI 1 Piece by Does not apply route once as needed for up to 1 dose.   Continuous Glucose Sensor (FREESTYLE LIBRE 3 PLUS SENSOR) MISC USE AS DIRECTED AND CHANGE SENSOR EVERY 15 DAYS.   fluticasone (FLONASE) 50 MCG/ACT nasal spray Place into both nostrils.   glucose blood (ACCU-CHEK GUIDE TEST) test strip Use to monitor glucose 2 times daily as instructed  hydrochlorothiazide  (HYDRODIURIL ) 12.5 MG tablet TAKE 1 TABLET BY MOUTH  DAILY   insulin  glargine (LANTUS  SOLOSTAR) 100 UNIT/ML Solostar Pen Inject 60 Units into the skin at bedtime.   Insulin  Pen Needle (ULTICARE SHORT PEN NEEDLES) 31G X 8 MM MISC USE AS DIRECTED   lisinopril  (PRINIVIL ,ZESTRIL ) 40 MG tablet TAKE 1 TABLET BY MOUTH  DAILY   nitroGLYCERIN (NITROSTAT) 0.4 MG SL tablet Place under the tongue.   pregabalin (LYRICA) 50 MG capsule Take 50 mg by mouth 2 (two) times daily.   [DISCONTINUED] LANTUS  SOLOSTAR 100 UNIT/ML Solostar Pen INJECT 50 UNITS SUBCUTANEOUSLY AT BEDTIME   [DISCONTINUED] MOUNJARO  2.5 MG/0.5ML Pen INJECT 2.5MG  SUBCUTANEOUSLY ONCE WEEKLY   No facility-administered encounter medications on file as of 05/16/2024.    ALLERGIES: Allergies  Allergen Reactions   Codeine Other (See Comments)    VACCINATION STATUS: There is no immunization history for the selected administration types on file for this patient.   Diabetes She presents for her follow-up diabetic visit. She has type 2 diabetes  mellitus. Onset time: She was diagnosed at approximate age of 51 years. Her disease course has been improving. There are no hypoglycemic associated symptoms. Pertinent negatives for hypoglycemia include no confusion, headaches, pallor or seizures. Associated symptoms include fatigue, polydipsia and polyuria. Pertinent negatives for diabetes include no blurred vision, no chest pain and no polyphagia. There are no hypoglycemic complications. Symptoms are improving. Diabetic complications include heart disease, nephropathy and peripheral neuropathy. Risk factors for coronary artery disease include diabetes mellitus, dyslipidemia, family history, obesity, hypertension, sedentary lifestyle, post-menopausal and tobacco exposure. Current diabetic treatment includes insulin  injections. She is compliant with treatment none of the time. Her weight is fluctuating minimally. She is following a generally unhealthy diet. When asked about meal planning, she reported none. She never participates in exercise. Her home blood glucose trend is decreasing steadily. Her breakfast blood glucose range is generally 140-180 mg/dl. Her lunch blood glucose range is generally 140-180 mg/dl. Her dinner blood glucose range is generally 140-180 mg/dl. Her bedtime blood glucose range is generally 140-180 mg/dl. Her overall blood glucose range is 140-180 mg/dl. (She presents with improved glycemic profile.  Her average blood glucose 178 for the most recent 2 weeks.  Her freestyle libre device AGP shows 52% time in range, 32% level 1 hyperglycemia, 15% level 2 hyperglycemia.  She has no hypoglycemia.  Her point-of-care A1c is 8.9% improving from 10.6% during her last visit.    ) An ACE inhibitor/angiotensin II receptor blocker is being taken. Eye exam is current.  Hyperlipidemia This is a chronic problem. The current episode started more than 1 year ago. The problem is controlled. Exacerbating diseases include diabetes and obesity. Pertinent  negatives include no chest pain, myalgias or shortness of breath. Current antihyperlipidemic treatment includes statins. Risk factors for coronary artery disease include dyslipidemia, diabetes mellitus, hypertension, obesity, post-menopausal and a sedentary lifestyle.  Hypertension The current episode started more than 1 year ago. The problem is uncontrolled. Pertinent negatives include no blurred vision, chest pain, headaches, palpitations or shortness of breath. Risk factors for coronary artery disease include dyslipidemia, diabetes mellitus, family history, obesity, sedentary lifestyle and smoking/tobacco exposure. Past treatments include ACE inhibitors. Hypertensive end-organ damage includes kidney disease and CAD/MI.     Objective:       05/16/2024    9:48 AM 12/14/2023   11:01 AM 07/06/2023   10:16 AM  Vitals with BMI  Height 5' 4 5' 4 5' 4  Weight 194 lbs 187 lbs 13  oz 195 lbs 6 oz  BMI 33.28 32.22 33.52  Systolic 138 150 879  Diastolic 66 76 78  Pulse 72 76 68    BP 138/66   Pulse 72   Ht 5' 4 (1.626 m)   Wt 194 lb (88 kg)   BMI 33.30 kg/m   Wt Readings from Last 3 Encounters:  05/16/24 194 lb (88 kg)  12/14/23 187 lb 12.8 oz (85.2 kg)  07/06/23 195 lb 6.4 oz (88.6 kg)     CMP     Component Value Date/Time   NA 141 12/07/2023 1012   K 4.4 12/07/2023 1012   CL 98 12/07/2023 1012   CO2 27 12/07/2023 1012   GLUCOSE 174 (H) 12/07/2023 1012   GLUCOSE 98 04/19/2021 1807   BUN 14 12/07/2023 1012   CREATININE 0.97 12/07/2023 1012   CREATININE 1.15 (H) 04/28/2016 0929   CALCIUM  10.3 12/07/2023 1012   PROT 6.8 12/07/2023 1012   ALBUMIN 4.4 12/07/2023 1012   AST 23 12/07/2023 1012   ALT 19 12/07/2023 1012   ALKPHOS 136 (H) 12/07/2023 1012   BILITOT 0.7 12/07/2023 1012   GFRNONAA 46 (L) 04/19/2021 1807     Diabetic Labs (most recent): Lab Results  Component Value Date   HGBA1C 8.9 (A) 05/16/2024   HGBA1C 10.6 (A) 12/14/2023   HGBA1C 10.6 (A) 06/01/2023      Lipid Panel ( most recent) Lipid Panel     Component Value Date/Time   CHOL 165 12/07/2023 1012   TRIG 65 12/07/2023 1012   HDL 48 12/07/2023 1012   CHOLHDL 3.4 12/07/2023 1012   CHOLHDL 3.4 01/26/2016 0858   VLDL 15 01/26/2016 0858   LDLCALC 104 (H) 12/07/2023 1012   LABVLDL 13 12/07/2023 1012      Lab Results  Component Value Date   TSH 0.804 12/14/2022   TSH 0.71 01/26/2016   FREET4 1.49 12/14/2022   FREET4 1.2 01/26/2016      Assessment & Plan:   1. DM type 2 causing vascular disease (HCC)   - Robyn Crawford has currently uncontrolled symptomatic type 2 DM since  78 years of age.  She presents with improved glycemic profile.  Her average blood glucose 178 for the most recent 2 weeks.  Her freestyle libre device AGP shows 52% time in range, 32% level 1 hyperglycemia, 15% level 2 hyperglycemia.  She has no hypoglycemia.  Her point-of-care A1c is 8.9% improving from 10.6% during her last visit.     Recent labs reviewed. - I had a long discussion with her about the possible risk factors and  the pathology behind its diabetes and its complications. -her diabetes is complicated by coronary artery disease, CKD, obesity, sedentary life, noncompliance/nonadherence and she remains at a high risk for more acute and chronic complications which include CAD, CVA, CKD, retinopathy, and neuropathy. These are all discussed in detail with her. - I discussed all available options of managing her diabetes including de-escalation of medications. I have counseled her on Food as Medicine by adopting a Whole Food , Plant Predominant  ( WFPP) nutrition as recommended by [employer] of Lifestyle Medicine. Patient is encouraged to switch to  unprocessed or minimally processed  complex starch, adequate protein intake (mainly plant source), minimal liquid fat, plenty of fruits, and vegetables.  -  she is advised to stick to a routine mealtimes to eat 3 complete meals a day and snack only  when necessary ( to snack only to correct hypoglycemia BG <  70 day time or <100 at night).   -She did not engage optimally with lifestyle medicine.  However she wishes to have the plan.   - she acknowledges that there is a room for improvement in her food and drink choices. - Suggestion is made for her to avoid simple carbohydrates  from her diet including Cakes, Sweet Desserts, Ice Cream, Soda (diet and regular), Sweet Tea, Candies, Chips, Cookies, Store Bought Juices, Alcohol , Artificial Sweeteners,  Coffee Creamer, and Sugar-free Products, Lemonade. This will help patient to have more stable blood glucose profile and potentially avoid unintended weight gain.  - I have approached her with the following individualized plan to manage  her diabetes and patient agrees:    -Based on her better glycemic presentation, she would not need prandial insulin  for now.  I discussed and increased her Lantus  to 60 units nightly,  urged her to start utilizing her CGM properly.   Her insurance did not provide coverage for Jardiance , will discontinue. - She has tolerated Mounjaro  at low-dose, discussed and increase Mounjaro  to 5 mg subcutaneously weekly.  This medication will be advanced as she tolerates.    - she is encouraged to call clinic for blood glucose levels less than 70 or above 200 mg /dl.  If she calls for hyperglycemia, she will be considered for prandial insulin . She does not tolerate metformin .   - Specific targets for  A1c;  LDL, HDL,  and Triglycerides were discussed with the patient.  2) Blood Pressure /Hypertension:   -Her blood pressure is controlled to target.  she is advised to continue her current medications including lisinopril  40 mg p.o. daily with breakfast .  Light to moderate intensity exercise up to 30 -60 minutes daily for 3 to 4 days a week is discussed with her.      3) Lipids/Hyperlipidemia:   Review of her recent lipid panel showed worsening LDL at 104.  Her engagement  with plant-based diet is suboptimal, I will increase her atorvastatin  to 40 mg p.o. nightly.     Side effects and precautions discussed with her.  4)  Weight/Diet:  Body mass index is 33.3 kg/m.  -   clearly complicating her diabetes care.   she is  a candidate for weight loss. I discussed with her the fact that loss of 5 - 10% of her  current body weight will have the most impact on her diabetes management.  The above detailed  ACLM recommendations for nutrition, exercise, sleep, social life, avoidance of risky substances, the need for restorative sleep   information will also detailed on discharge instructions.  5) Chronic Care/Health Maintenance:  -she  2 on ACEI/ARB and Statin medications and  is encouraged to initiate and continue to follow up with Ophthalmology, Dentist,  Podiatrist at least yearly or according to recommendations, and advised to   stay away from smoking. I have recommended yearly flu vaccine and pneumonia vaccine at least every 5 years; moderate intensity exercise for up to 150 minutes weekly; and  sleep for 7- 9 hours a day.  Her foot exam is significant for dry skin, intact monofilament test sensation and dorsalis pedis and posterior tibial arterial pulses. - she is  advised to maintain close follow up with Pecolia Senior, MD for primary care needs, as well as her other providers for optimal and coordinated care.   I spent  41  minutes in the care of the patient today including review of labs from CMP, Lipids, Thyroid Function,  Hematology (current and previous including abstractions from other facilities); face-to-face time discussing  her blood glucose readings/logs, discussing hypoglycemia and hyperglycemia episodes and symptoms, medications doses, her options of short and long term treatment based on the latest standards of care / guidelines;  discussion about incorporating lifestyle medicine;  and documenting the encounter. Risk reduction counseling performed per USPSTF  guidelines to reduce  obesity and cardiovascular risk factors.     Please refer to Patient Instructions for Blood Glucose Monitoring and Insulin /Medications Dosing Guide  in media tab for additional information. Please  also refer to  Patient Self Inventory in the Media  tab for reviewed elements of pertinent patient history.  Robyn Crawford participated in the discussions, expressed understanding, and voiced agreement with the above plans.  All questions were answered to her satisfaction. she is encouraged to contact clinic should she have any questions or concerns prior to her return visit.       Follow up plan: - Return in about 6 months (around 11/16/2024) for F/U with Pre-visit Labs, Meter/CGM/Logs, A1c here.  Ranny Earl, MD  [facility] [address] [place], [place] [address] Phone: [phone]  Fax: [phone]    05/16/2024, 12:07 PM  This note was partially dictated with voice recognition software. Similar sounding words can be transcribed inadequately or may not  be corrected upon review.

## 2024-05-16 NOTE — Patient Instructions (Signed)

## 2024-06-29 ENCOUNTER — Other Ambulatory Visit: Payer: Self-pay | Admitting: "Endocrinology

## 2024-09-17 ENCOUNTER — Ambulatory Visit: Admitting: "Endocrinology

## 2024-10-22 ENCOUNTER — Other Ambulatory Visit: Payer: Self-pay | Admitting: "Endocrinology

## 2024-10-30 LAB — COMPREHENSIVE METABOLIC PANEL WITH GFR
ALT: 19 [IU]/L (ref 0–32)
AST: 21 [IU]/L (ref 0–40)
Albumin: 4.5 g/dL (ref 3.8–4.8)
Alkaline Phosphatase: 120 [IU]/L (ref 49–135)
BUN/Creatinine Ratio: 13 (ref 12–28)
BUN: 15 mg/dL (ref 8–27)
Bilirubin Total: 0.6 mg/dL (ref 0.0–1.2)
CO2: 28 mmol/L (ref 20–29)
Calcium: 10.3 mg/dL (ref 8.7–10.3)
Chloride: 102 mmol/L (ref 96–106)
Creatinine, Ser: 1.14 mg/dL — ABNORMAL HIGH (ref 0.57–1.00)
Globulin, Total: 2.3 g/dL (ref 1.5–4.5)
Glucose: 146 mg/dL — ABNORMAL HIGH (ref 70–99)
Potassium: 4.2 mmol/L (ref 3.5–5.2)
Sodium: 143 mmol/L (ref 134–144)
Total Protein: 6.8 g/dL (ref 6.0–8.5)
eGFR: 50 mL/min/{1.73_m2} — ABNORMAL LOW

## 2024-10-30 LAB — LIPID PANEL
Chol/HDL Ratio: 2.9 ratio (ref 0.0–4.4)
Cholesterol, Total: 157 mg/dL (ref 100–199)
HDL: 54 mg/dL
LDL Chol Calc (NIH): 91 mg/dL (ref 0–99)
Triglycerides: 58 mg/dL (ref 0–149)
VLDL Cholesterol Cal: 12 mg/dL (ref 5–40)

## 2024-10-30 LAB — FIB-4 W/REFLEX TO ELF
FIB-4 Index: 1.28 (ref 0.00–2.67)
Platelets: 289 10*3/uL (ref 150–450)

## 2024-11-06 ENCOUNTER — Ambulatory Visit: Admitting: "Endocrinology
